# Patient Record
Sex: Female | Born: 1990 | Race: White | Hispanic: No | Marital: Married | Smoking: Former smoker
Health system: Southern US, Community
[De-identification: ages and names within clinical notes are randomized; demographics above are authoritative.]

## PROBLEM LIST (undated history)

## (undated) DIAGNOSIS — F32A Depression, unspecified: Secondary | ICD-10-CM

## (undated) DIAGNOSIS — Z8489 Family history of other specified conditions: Secondary | ICD-10-CM

## (undated) DIAGNOSIS — F419 Anxiety disorder, unspecified: Secondary | ICD-10-CM

## (undated) HISTORY — PX: KNEE SURGERY: SHX244

## (undated) HISTORY — PX: TUBAL LIGATION: SHX77

## (undated) HISTORY — PX: COLONOSCOPY: SHX174

## (undated) HISTORY — PX: CHOLECYSTECTOMY: SHX55

## (undated) HISTORY — PX: RHINOPLASTY: SUR1284

---
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
## 2009-01-26 ENCOUNTER — Emergency Department (HOSPITAL_COMMUNITY): Admission: EM | Admit: 2009-01-26 | Discharge: 2009-01-26 | Payer: Self-pay | Admitting: Emergency Medicine

## 2009-05-16 ENCOUNTER — Inpatient Hospital Stay (HOSPITAL_COMMUNITY): Admission: AD | Admit: 2009-05-16 | Discharge: 2009-05-16 | Payer: Self-pay | Admitting: Obstetrics and Gynecology

## 2009-05-18 ENCOUNTER — Inpatient Hospital Stay (HOSPITAL_COMMUNITY): Admission: AD | Admit: 2009-05-18 | Discharge: 2009-05-18 | Payer: Self-pay | Admitting: Obstetrics & Gynecology

## 2009-05-25 ENCOUNTER — Ambulatory Visit (HOSPITAL_COMMUNITY): Admission: RE | Admit: 2009-05-25 | Discharge: 2009-05-25 | Payer: Self-pay | Admitting: Obstetrics and Gynecology

## 2010-04-14 HISTORY — PX: KNEE SURGERY: SHX244

## 2010-05-05 ENCOUNTER — Encounter: Payer: Self-pay | Admitting: Obstetrics & Gynecology

## 2010-07-04 LAB — HCG, QUANTITATIVE, PREGNANCY
hCG, Beta Chain, Quant, S: 1829 m[IU]/mL — ABNORMAL HIGH (ref <5)
hCG, Beta Chain, Quant, S: 4326 m[IU]/mL — ABNORMAL HIGH (ref <5)

## 2010-07-04 LAB — URINE CULTURE
Colony Count: NO GROWTH
Culture: NO GROWTH

## 2010-07-04 LAB — URINALYSIS, ROUTINE W REFLEX MICROSCOPIC
Bilirubin Urine: NEGATIVE
Glucose, UA: NEGATIVE mg/dL
Ketones, ur: NEGATIVE mg/dL
Nitrite: NEGATIVE
Protein, ur: NEGATIVE mg/dL
Specific Gravity, Urine: 1.02 (ref 1.005–1.030)
Urobilinogen, UA: 0.2 mg/dL (ref 0.0–1.0)
pH: 6 (ref 5.0–8.0)

## 2010-07-04 LAB — WET PREP, GENITAL
Trich, Wet Prep: NONE SEEN
Yeast Wet Prep HPF POC: NONE SEEN

## 2010-07-04 LAB — URINE MICROSCOPIC-ADD ON

## 2010-07-04 LAB — GC/CHLAMYDIA PROBE AMP, GENITAL
Chlamydia, DNA Probe: NEGATIVE
GC Probe Amp, Genital: NEGATIVE

## 2011-09-02 ENCOUNTER — Emergency Department: Payer: Self-pay | Admitting: Emergency Medicine

## 2012-02-11 ENCOUNTER — Ambulatory Visit: Payer: Self-pay | Admitting: Orthopedic Surgery

## 2012-02-21 LAB — COMPREHENSIVE METABOLIC PANEL
Albumin: 4.1 g/dL (ref 3.4–5.0)
Alkaline Phosphatase: 73 U/L (ref 50–136)
Anion Gap: 8 (ref 7–16)
BUN: 8 mg/dL (ref 7–18)
Bilirubin,Total: 0.5 mg/dL (ref 0.2–1.0)
Calcium, Total: 8.6 mg/dL (ref 8.5–10.1)
Chloride: 106 mmol/L (ref 98–107)
Co2: 24 mmol/L (ref 21–32)
Creatinine: 0.72 mg/dL (ref 0.60–1.30)
EGFR (African American): >60
EGFR (Non-African Amer.): >60
Glucose: 84 mg/dL (ref 65–99)
Osmolality: 273 (ref 275–301)
Potassium: 3.5 mmol/L (ref 3.5–5.1)
SGOT(AST): 23 U/L (ref 15–37)
SGPT (ALT): 24 U/L (ref 12–78)
Sodium: 138 mmol/L (ref 136–145)
Total Protein: 7.5 g/dL (ref 6.4–8.2)

## 2012-02-21 LAB — CBC
HCT: 39.2 % (ref 35.0–47.0)
HGB: 13.8 g/dL (ref 12.0–16.0)
MCH: 31.4 pg (ref 26.0–34.0)
MCHC: 35.1 g/dL (ref 32.0–36.0)
MCV: 89 fL (ref 80–100)
Platelet: 237 10*3/uL (ref 150–440)
RBC: 4.38 10*6/uL (ref 3.80–5.20)
RDW: 12.3 % (ref 11.5–14.5)
WBC: 13.2 10*3/uL — ABNORMAL HIGH (ref 3.6–11.0)

## 2012-02-22 ENCOUNTER — Inpatient Hospital Stay: Payer: Self-pay | Admitting: Specialist

## 2012-02-22 LAB — RAPID INFLUENZA A&B ANTIGENS

## 2012-02-23 LAB — CBC WITH DIFFERENTIAL/PLATELET
Basophil #: 0 10*3/uL (ref 0.0–0.1)
Basophil %: 0.1 %
Eosinophil #: 0 10*3/uL (ref 0.0–0.7)
Eosinophil %: 0.1 %
HCT: 35.7 % (ref 35.0–47.0)
HGB: 12 g/dL (ref 12.0–16.0)
Lymphocyte #: 0.9 10*3/uL — ABNORMAL LOW (ref 1.0–3.6)
Lymphocyte %: 4.7 %
MCH: 30.4 pg (ref 26.0–34.0)
MCHC: 33.6 g/dL (ref 32.0–36.0)
MCV: 90 fL (ref 80–100)
Monocyte #: 0.5 x10 3/mm (ref 0.2–0.9)
Monocyte %: 3 %
Neutrophil #: 16.7 10*3/uL — ABNORMAL HIGH (ref 1.4–6.5)
Neutrophil %: 92.1 %
Platelet: 243 10*3/uL (ref 150–440)
RBC: 3.95 10*6/uL (ref 3.80–5.20)
RDW: 12.7 % (ref 11.5–14.5)
WBC: 18.1 10*3/uL — ABNORMAL HIGH (ref 3.6–11.0)

## 2012-02-23 LAB — BASIC METABOLIC PANEL
Anion Gap: 8 (ref 7–16)
BUN: 8 mg/dL (ref 7–18)
Calcium, Total: 8.9 mg/dL (ref 8.5–10.1)
Chloride: 110 mmol/L — ABNORMAL HIGH (ref 98–107)
Co2: 22 mmol/L (ref 21–32)
Creatinine: 0.68 mg/dL (ref 0.60–1.30)
EGFR (African American): >60
EGFR (Non-African Amer.): >60
Glucose: 167 mg/dL — ABNORMAL HIGH (ref 65–99)
Osmolality: 282 (ref 275–301)
Potassium: 4.3 mmol/L (ref 3.5–5.1)
Sodium: 140 mmol/L (ref 136–145)

## 2012-02-24 LAB — CBC WITH DIFFERENTIAL/PLATELET
Basophil #: 0 10*3/uL (ref 0.0–0.1)
Basophil %: 0.1 %
Eosinophil #: 0 10*3/uL (ref 0.0–0.7)
Eosinophil %: 0 %
HCT: 35.7 % (ref 35.0–47.0)
HGB: 11.8 g/dL — ABNORMAL LOW (ref 12.0–16.0)
Lymphocyte #: 1.2 10*3/uL (ref 1.0–3.6)
Lymphocyte %: 6.8 %
MCH: 30.2 pg (ref 26.0–34.0)
MCHC: 33 g/dL (ref 32.0–36.0)
MCV: 91 fL (ref 80–100)
Monocyte #: 0.4 x10 3/mm (ref 0.2–0.9)
Monocyte %: 2.5 %
Neutrophil #: 15.7 10*3/uL — ABNORMAL HIGH (ref 1.4–6.5)
Neutrophil %: 90.6 %
Platelet: 259 10*3/uL (ref 150–440)
RBC: 3.91 10*6/uL (ref 3.80–5.20)
RDW: 12.9 % (ref 11.5–14.5)
WBC: 17.3 10*3/uL — ABNORMAL HIGH (ref 3.6–11.0)

## 2012-02-27 LAB — EXPECTORATED SPUTUM ASSESSMENT W GRAM STAIN, RFLX TO RESP C

## 2012-02-27 LAB — CULTURE, BLOOD (SINGLE)

## 2012-03-30 ENCOUNTER — Ambulatory Visit: Payer: Self-pay | Admitting: Orthopedic Surgery

## 2012-07-23 ENCOUNTER — Emergency Department: Payer: Self-pay | Admitting: Emergency Medicine

## 2012-07-23 LAB — URINALYSIS, COMPLETE
Bilirubin,UR: NEGATIVE
Blood: NEGATIVE
Glucose,UR: NEGATIVE mg/dL (ref 0–75)
Ketone: NEGATIVE
Nitrite: NEGATIVE
Ph: 5 (ref 4.5–8.0)
Protein: NEGATIVE
RBC,UR: 5 /HPF (ref 0–5)
Specific Gravity: 1.027 (ref 1.003–1.030)
Squamous Epithelial: 15
WBC UR: 7 /HPF (ref 0–5)

## 2012-07-23 LAB — COMPREHENSIVE METABOLIC PANEL
Albumin: 4.3 g/dL (ref 3.4–5.0)
Alkaline Phosphatase: 76 U/L (ref 50–136)
Anion Gap: 4 — ABNORMAL LOW (ref 7–16)
BUN: 12 mg/dL (ref 7–18)
Bilirubin,Total: 0.4 mg/dL (ref 0.2–1.0)
Calcium, Total: 8.6 mg/dL (ref 8.5–10.1)
Chloride: 108 mmol/L — ABNORMAL HIGH (ref 98–107)
Co2: 27 mmol/L (ref 21–32)
Creatinine: 0.57 mg/dL — ABNORMAL LOW (ref 0.60–1.30)
EGFR (African American): >60
EGFR (Non-African Amer.): >60
Glucose: 77 mg/dL (ref 65–99)
Osmolality: 276 (ref 275–301)
Potassium: 3.7 mmol/L (ref 3.5–5.1)
SGOT(AST): 29 U/L (ref 15–37)
SGPT (ALT): 29 U/L (ref 12–78)
Sodium: 139 mmol/L (ref 136–145)
Total Protein: 7.8 g/dL (ref 6.4–8.2)

## 2012-07-23 LAB — CBC
HCT: 43.3 % (ref 35.0–47.0)
HGB: 15 g/dL (ref 12.0–16.0)
MCH: 31 pg (ref 26.0–34.0)
MCHC: 34.7 g/dL (ref 32.0–36.0)
MCV: 89 fL (ref 80–100)
Platelet: 237 10*3/uL (ref 150–440)
RBC: 4.86 10*6/uL (ref 3.80–5.20)
RDW: 11.9 % (ref 11.5–14.5)
WBC: 10.5 10*3/uL (ref 3.6–11.0)

## 2012-07-23 LAB — LIPASE, BLOOD: Lipase: 248 U/L (ref 73–393)

## 2012-09-13 ENCOUNTER — Ambulatory Visit: Payer: Self-pay

## 2013-01-13 HISTORY — PX: RHINOPLASTY: SUR1284

## 2013-01-13 HISTORY — PX: SKIN LESION EXCISION: SHX2412

## 2014-04-26 ENCOUNTER — Ambulatory Visit: Payer: Self-pay | Admitting: Obstetrics and Gynecology

## 2014-04-26 LAB — CLOSTRIDIUM DIFFICILE(ARMC)

## 2014-04-27 ENCOUNTER — Ambulatory Visit: Payer: Self-pay | Admitting: Unknown Physician Specialty

## 2014-05-12 ENCOUNTER — Ambulatory Visit: Payer: Self-pay | Admitting: Unknown Physician Specialty

## 2014-05-25 ENCOUNTER — Ambulatory Visit: Payer: Self-pay | Admitting: Internal Medicine

## 2014-06-15 ENCOUNTER — Other Ambulatory Visit: Payer: Self-pay | Admitting: Unknown Physician Specialty

## 2014-07-28 ENCOUNTER — Ambulatory Visit
Admit: 2014-07-28 | Disposition: A | Discharge status: Home / Self Care | Payer: Self-pay | Attending: Family Medicine | Admitting: Family Medicine

## 2014-08-01 NOTE — Op Note (Signed)
PATIENT NAME:  Deborah Sanchez, Deborah Sanchez MR#:  [id number] DATE OF BIRTH:  12/04/1990  DATE OF PROCEDURE:  03/30/2012  PREOPERATIVE DIAGNOSIS: Left knee patellar subluxation.   POSTOPERATIVE DIAGNOSIS: Left knee patellar subluxation.  PROCEDURE: Left knee arthroscopic lateral release.   SURGEON: Leitha SchullerMichael J. Jasiyah Paulding, MD   ANESTHESIA: Spinal.   DESCRIPTION OF PROCEDURE: The patient was brought to the operating room and, after adequate spinal anesthesia was obtained, the left leg was prepped and draped in the usual sterile fashion with a tourniquet applied. After patient identification and timeout procedures were completed, an inferolateral portal was made and the arthroscope was introduced. Inspection revealed mild chondromalacia and some patellar subluxation with a very tight lateral retinaculum. Coming around medially, an inferomedial portal was made and the articular cartilage and meniscus appeared normal and medial and lateral compartments with intact ACL. Gutters were free of any loose bodies. At this point, an ArthroCare wand was used to create a lateral release releasing the tight lateral retinaculum and increasing the space between the patella and the femoral trochlea and getting it more in the midline. The ArthroCare wand was also used to obtain hemostasis of the cut to minimize postoperative bleeding. All instrumentation was withdrawn and 30 mL of 0.5% Sensorcaine with epinephrine was infiltrated in the areas of the portals as well as in the area of the lateral release. Xeroform, 4 x 4's, Webril and Ace wrap were applied. The patient was sent to the recovery room in stable condition.   ESTIMATED BLOOD LOSS: Minimal.          COMPLICATIONS: None.   SPECIMEN: None. ____________________________ Leitha SchullerMichael J. Camryn Lampson, MD mjm:sb D: 03/30/2012 21:49:20 ET T: 03/31/2012 10:04:58 ET JOB#: [id number]  cc: Leitha SchullerMichael J. Jowanda Heeg, MD, <Dictator> Leitha SchullerMICHAEL J Kimba Lottes MD ELECTRONICALLY SIGNED 03/31/2012 13:06

## 2014-08-01 NOTE — H&P (Signed)
PATIENT NAME:  Deborah Sanchez, Deborah Sanchez MR#:  [id number] DATE OF BIRTH:  Sep 11, 1990  DATE OF ADMISSION:  02/22/2012  PRIMARY CARE PHYSICIAN: Barry Brunner, MD   CHIEF COMPLAINT: Cough, shortness of breath and wheezing for two days.   HISTORY OF PRESENT ILLNESS: The patient is a 24 year old Caucasian female with no significant past medical history presenting to the ER with chief complaint of shortness of breath, cough and wheezing for the past two days. She is reporting that her son is sick and coughing. She denies any fever but sweating a lot. She is bringing up clear phlegm. The patient came into the ER as her shortness of breath associated with cough and wheezing is getting worse and she could not breathe. When the patient came into the ER, she was not able to speak in full sentences and she was quite short of breath. She was very tight in her chest but chest x-ray did not show any acute findings. The patient has received IV Solu-Medrol 125 mg and 4 to 5 DuoNeb treatments. Hospitalist team is called to admit the patient as patient was still being tight in her chest after getting several breathing treatments. Blood cultures were obtained and IV levofloxacin was ordered. The patient is complaining of tightness in her chest while coughing. D-dimers are elevated and CT angiogram of the chest is ordered to rule out pulmonary embolism as the patient has subcutaneous implant for birth control and the patient's D-dimer being elevated. CT chest is pending.   PAST MEDICAL HISTORY: None.   PAST SURGICAL HISTORY: Mastoidectomy in 1997.  MEDICATIONS: Not on any home medications.    ALLERGIES: No known drug allergies.   PSYCHOSOCIAL HISTORY: The patient lives at home with her parents and son. Smokes half pack a day. Denies alcohol or illicit drug usage.   FAMILY HISTORY: Father had history of diabetes mellitus.  REVIEW OF SYSTEMS: CONSTITUTIONAL: Denies fever but complaining of fatigue, weakness. Denies any weight  loss or weight gain. EYES: Denies any blurry vision, cataracts, inflammation. ENT: Denies tinnitus, ear pain, hearing loss, snoring, or postnasal drip. RESPIRATORY: Positive cough. Positive wheezing. Denies hemoptysis. Denies asthma. Positive dyspnea. Positive painful respiration. Denies COPD or pneumonia. CARDIOVASCULAR: Chest pain with deep inspiration. Denies any orthopnea. Denies edema. Denies arrhythmia, palpitations, or syncope. GI: Positive nausea. Denies vomiting or diarrhea. Denies any abdominal pain, hematemesis, GERD, or rectal bleeding. GENITOURINARY: Denies dysuria, hematuria. GYN AND BREASTS: Denies any breast masses, tenderness, discharge. ENDOCRINE: Denies polyuria or polydipsia. Denies any thyroid problems. HEMATOLOGIC/LYMPHATIC: Denies anemia, easy bruising, bleeding or swollen glands. INTEGUMENTARY: Denies acne, rash, lesions. MUSCULOSKELETAL: Denies any pain in the neck, back, shoulder. NEUROLOGIC: Denies any dysarthria, weakness, or numbness. PSYCH: Denies any insomnia, ADD, OCD.   PHYSICAL EXAMINATION:   VITAL SIGNS: Temperature 98.4, pulse 110, respirations initially 23, eventually trended down to 19 to 21, pulse oximetry 93%. Blood pressure 115/63 after fluid bolus.   GENERAL APPEARANCE: Sick looking well built and well nourished not in acute distress.   HEENT: Normocephalic, atraumatic. Pupils are equally reacting to light and accommodation. Somewhat dry mucous membranes. The patient's face is flushed.   NECK: Supple. No JVD. No carotid bruits. No thyromegaly.   LUNGS: Moderate air entry. Bronchial breath sounds. No rhonchi. Positive wheezing diffusely bilaterally.   CARDIAC: S1, S2 normal. Regular rate and rhythm, tachycardic. Point of maximum impulse is intact. No reproducible tenderness in the anterior chest wall.  GI: Soft. Bowel sounds are positive in all four quadrants. Nontender, nondistended. No masses  felt.  NEUROLOGIC: Alert and oriented x3. Cranial nerves II  through XII are grossly intact. Reflexes are 2+.  SKIN: No lesions. No rashes. No erythema.   EXTREMITIES: No edema. No cyanosis. No clubbing.  LABS AND IMAGING STUDIES: Chest x-ray no acute findings.   CT angiogram of the chest is pending.   Urine pregnancy test is pending. Nasal swab for Influenza A and B is pending. Glucose 84, BUN 8, creatinine 0.72, sodium 138, potassium 3.5, chloride 106, CO2 24, GFR greater than 60, serum osmolality 273, calcium 8.6, WBC 13.2, hemoglobin 13.8, hematocrit 39.2, platelet count 237,000. D-dimer is elevated at 0.48.   ASSESSMENT AND PLAN:  1. Acute bronchitis, probably viral. Will admit to inpatient status. Solu-Medrol 60 mg IV q.6 hours will be provided. Levofloxacin 750 mg IV daily. DuoNeb neb treatments q.6 hours and albuterol neb treatments on an as needed basis. Nasal swab for Influenza A and B ordered.  2. Elevated D-dimer and history of implant for contraception. CT angiogram of the chest is ordered to rule out pulmonary embolism which is of low probability.  3. Nicotine dependence. The patient was provided with counseling to quit smoking. Will provide her nicotine patch.  4. Will provide her GI prophylaxis with ranitidine.  5. DVT prophylaxis with TEDs.  Plan of care was discussed in detail with the patient. She is agreeable with the plan.   TOTAL TIME SPENT ON THIS ADMISSION: 50 minutes.   ____________________________ Ramonita LabAruna Ceniya Fowers, MD ag:drc D: 02/22/2012 04:03:59 ET T: 02/22/2012 09:50:40 ET JOB#: [id number]  cc: Ramonita LabAruna Lauri Till, MD, <Dictator> Jorje GuildGlenn R. Beckey DowningWillett, MD Ramonita LabARUNA Amandeep Nesmith MD ELECTRONICALLY SIGNED 02/22/2012 22:50

## 2014-08-01 NOTE — Discharge Summary (Signed)
PATIENT NAME:  Deborah Sanchez, [name] MR#:  [id number] DATE OF BIRTH:  1990/12/08  DATE OF ADMISSION:  02/22/2012 DATE OF DISCHARGE:  02/25/2012  For a detailed note, please take a look at the history and physical on admission by Dr. Amado CoeGouru.    DIAGNOSES AT DISCHARGE:  1. Acute respiratory failure likely secondary to asthma exacerbation/reactive airway disease.  2. Asthma exacerbation/reactive airway disease.  3. Leukocytosis.   DIET: Patient is being discharged on a regular diet.   ACTIVITY: As tolerated.   FOLLOW UP: Follow up is with Dr. Barry BrunnerGlenn Willett in the next 1 to 2 weeks.   DISCHARGE MEDICATIONS:  1. Prednisone taper starting at 60 mg down to 10 mg over the next 12 days.  2. Advair 250/50, 1 puff b.i.d.  3. Albuterol inhaler 2 puffs 4 times daily as needed.   LABORATORY, DIAGNOSTIC AND RADIOLOGICAL DATA: CT scan of the chest done with contrast on admission showing no evidence of pulmonary embolism but patchy airspace disease with ground glass opacities involving the right perihilar region and right upper lobe and left lower lobe secondary to infectious or inflammatory etiology. Moderate pneumomediastinum.  A chest x-ray showing no acute cardiopulmonary disease.   BRIEF HOSPITAL COURSE: This is a 24 year old female with no significant past medical history who presented to the hospital with shortness of breath and cough likely secondary to asthma/reactive airway disease exacerbation.  1. Acute respiratory failure. This was likely secondary to asthma exacerbation. Patient has had some allergic rhinitis but never been diagnosed with asthma. She presented with severe bronchospasm and wheezing. She was therefore started on IV steroids, empiric antibiotics and also around-the-clock nebulizer treatments along with inhaled corticosteroids. Patient's clinical symptoms since admission have significantly improved. She still has some wheezing and bronchospasm but is moving good air and her shortness  of breath has significantly improved. She likely has undiagnosed asthma. For now I am discharging her on a long prednisone taper along with an albuterol inhaler and also Advair. She likely should have outpatient PFTs and also follow up with pulmonary as an outpatient. She does smoke tobacco and was strongly advised to quit smoking.  2. Leukocytosis. This was likely secondary to steroids. Her chest x-ray and her CT chest did not show any evidence of acute pneumonia. Her white cell count can be further followed as an outpatient.  3. Pneumomediastinum. This was incidentally noted on the CT scan on admission. I discussed this with a pulmonologist who did not think that the patient had an acute esophageal injury or any evidence of air in the mediastinum. This was probably secondary to air trapping from her asthma exacerbation. She clinically does not show any signs of severe hypoxemia. This can be further followed by a pulmonologist as an outpatient if needed. She likely would benefit from outpatient PFTs.  4. Tobacco abuse. Patient was strongly advised to quit smoking, was maintained on nicotine patch while in the hospital.  5. CODE STATUS: Patient is a FULL CODE.   TIME SPENT ON DISCHARGE: 40 minutes.  ____________________________ Rolly PancakeVivek J. Cherlynn KaiserSainani, MD vjs:cms D: 02/25/2012 15:06:40 ET T: 02/25/2012 17:16:09 ET JOB#: [id number]  cc: Rolly PancakeVivek J. Cherlynn KaiserSainani, MD, <Dictator> Jorje GuildGlenn R. Beckey DowningWillett, MD Houston SirenVIVEK J Deaken Jurgens MD ELECTRONICALLY SIGNED 02/26/2012 8:12

## 2014-08-07 LAB — SURGICAL PATHOLOGY

## 2014-12-03 ENCOUNTER — Ambulatory Visit
Admission: EM | Admit: 2014-12-03 | Discharge: 2014-12-03 | Disposition: A | Discharge status: Home / Self Care | Payer: BLUE CROSS/BLUE SHIELD | Attending: Family Medicine | Admitting: Family Medicine

## 2014-12-03 ENCOUNTER — Ambulatory Visit: Payer: BLUE CROSS/BLUE SHIELD

## 2014-12-03 DIAGNOSIS — S83002S Unspecified subluxation of left patella, sequela: Secondary | ICD-10-CM

## 2014-12-03 DIAGNOSIS — F17200 Nicotine dependence, unspecified, uncomplicated: Secondary | ICD-10-CM | POA: Diagnosis not present

## 2014-12-03 DIAGNOSIS — S83002A Unspecified subluxation of left patella, initial encounter: Secondary | ICD-10-CM | POA: Diagnosis not present

## 2014-12-03 DIAGNOSIS — X58XXXS Exposure to other specified factors, sequela: Secondary | ICD-10-CM | POA: Diagnosis not present

## 2014-12-03 DIAGNOSIS — M25562 Pain in left knee: Secondary | ICD-10-CM | POA: Diagnosis present

## 2014-12-03 MED ORDER — KETOROLAC TROMETHAMINE 60 MG/2ML IM SOLN
60.0000 mg | Freq: Once | INTRAMUSCULAR | Status: AC
  Administered 2014-12-03: 60 mg via INTRAMUSCULAR

## 2014-12-03 MED ORDER — NAPROXEN 500 MG PO TABS: 500.0000 mg | ORAL_TABLET | Freq: Two times a day (BID) | ORAL | Status: DC

## 2014-12-03 NOTE — ED Notes (Signed)
Pt with Left knee pain, chronic, post surgery. Injured leg last week. Pain, tingling down leg.

## 2014-12-03 NOTE — ED Provider Notes (Signed)
CSN: [id number]     Arrival date & time 12/03/14  1458 History   First MD Initiated Contact with Patient 12/03/14 1551     Chief Complaint  Patient presents with  . Knee Pain   (Consider location/radiation/quality/duration/timing/severity/associated sxs/prior Treatment) HPI  The 24 year old female who is had a number of problems with her left knee and previous  patella injuries described as dislocation subluxations. In 2014 she underwent arthroscopy with lateral retinacular release according to her description. She got better over time but this [place] pier onto the sand and with a twisting motion again subluxed her knee medially. It hurt at first but gradually improved until at work she turned a corner and happened again. At that time she had to "knock it back in place" to reduce it. She then went on a long walk with a friend and since then has been having a lot of pain in her knee. To her she says it looks like it's out of alignment. This may be from some slight swelling that she has anteriorly.  History reviewed. No pertinent past medical history. Past Surgical History  Procedure Laterality Date  . Knee surgery    . Rhinoplasty     History reviewed. No pertinent family history. Social History  Substance Use Topics  . Smoking status: Current Every Day Smoker  . Smokeless tobacco: Never Used  . Alcohol Use: No   OB History    No data available     Review of Systems  Constitutional: Positive for activity change.  Musculoskeletal: Positive for arthralgias.  All other systems reviewed and are negative.   Allergies  Review of patient's allergies indicates no known allergies.  Home Medications   Prior to Admission medications   Medication Sig Start Date End Date Taking? Authorizing Provider  norgestimate-ethinyl estradiol (ORTHO-CYCLEN,SPRINTEC,PREVIFEM) 0.25-35 MG-MCG tablet Take 1 tablet by mouth daily.   Yes Historical Provider, MD   naproxen (NAPROSYN) 500 MG tablet Take 1 tablet (500 mg total) by mouth 2 (two) times daily with a meal. 12/03/14   Lutricia Feil, PA-C   BP 92/56 mmHg  Pulse 81  Temp(Src) 98 F (36.7 C) (Oral)  Resp 20  Ht 5\' 3"  (1.6 m)  Wt 160 lb (72.576 kg)  BMI 28.35 kg/m2  SpO2 100%  LMP 11/08/2014 (Approximate) Physical Exam  Constitutional: She appears well-developed and well-nourished.  HENT:  Head: Normocephalic and atraumatic.  Eyes: Pupils are equal, round, and reactive to light.  Musculoskeletal:  Examination of her left knee has no effusion. There is a very mild anterior puffiness but no actual swelling induration or fluctuance present. She does have a positive Patellar apprehension test and some patellar tenderness. Is no effusion present range of motion shows the patella to track adequately. She has no  lateral or medial lateral ligament stability. Is a negative anterior drawer sign. Her quadriceps mechanism is soft but contracts though she complains of pain with that.  Nursing note and vitals reviewed.   ED Course  Procedures (including critical care time) Labs Review Labs Reviewed - No data to display  Imaging Review Dg Knee Ap/lat W/sunrise Left  12/03/2014   CLINICAL DATA:  Left knee patellar subluxation yesterday. History of arthroscopy in 2014 with a lateral release.  EXAM: LEFT KNEE 3 VIEWS  COMPARISON:  Knee MRI, 02/11/2012  FINDINGS: There is no evidence of fracture, dislocation, or joint effusion. There is no evidence of arthropathy or other focal bone abnormality.  Soft tissues are unremarkable.  IMPRESSION: Negative.   Electronically Signed   By: Amie Portland M.D.   On: 12/03/2014 16:27     MDM   1. Patellar subluxation, left, sequela    New Prescriptions   NAPROXEN (NAPROSYN) 500 MG TABLET    Take 1 tablet (500 mg total) by mouth 2 (two) times daily with a meal.  Plan: 1. Test/x-ray results and diagnosis reviewed with patient 2. rx as per orders; risks,  benefits, potential side effects reviewed with patient 3. Recommend supportive treatment with  4. F/u prn if symptoms worsen or don't improve  I reviewed the x-rays with the patient and her husband. I've recommended that she have a long leg and knee immobilizer protect her patella. Also instructed her in quadriceps strengthening exercises using isometrics only. Given her prescription for Naprosyn for pain and inflammation and provided a Toradol injection today for pain relief. I recommended that she follow-up with her orthopedic surgeon for follow-up. 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 Sections are for a two-week duration.  Lutricia Feil, PA-C 12/03/14 1646

## 2015-02-06 ENCOUNTER — Other Ambulatory Visit: Payer: Self-pay | Admitting: Unknown Physician Specialty

## 2015-02-06 DIAGNOSIS — M25562 Pain in left knee: Secondary | ICD-10-CM

## 2015-02-15 ENCOUNTER — Ambulatory Visit
Admission: RE | Admit: 2015-02-15 | Discharge: 2015-02-15 | Disposition: A | Discharge status: Home / Self Care | Payer: BLUE CROSS/BLUE SHIELD | Source: Ambulatory Visit | Attending: Unknown Physician Specialty | Admitting: Unknown Physician Specialty

## 2015-02-15 DIAGNOSIS — M2392 Unspecified internal derangement of left knee: Secondary | ICD-10-CM | POA: Insufficient documentation

## 2015-02-15 DIAGNOSIS — M25562 Pain in left knee: Secondary | ICD-10-CM

## 2015-10-10 ENCOUNTER — Ambulatory Visit
Admission: EM | Admit: 2015-10-10 | Discharge: 2015-10-10 | Disposition: A | Discharge status: Home / Self Care | Payer: BLUE CROSS/BLUE SHIELD | Attending: Family Medicine | Admitting: Family Medicine

## 2015-10-10 ENCOUNTER — Ambulatory Visit (INDEPENDENT_AMBULATORY_CARE_PROVIDER_SITE_OTHER): Payer: BLUE CROSS/BLUE SHIELD

## 2015-10-10 DIAGNOSIS — S7011XA Contusion of right thigh, initial encounter: Secondary | ICD-10-CM

## 2015-10-10 DIAGNOSIS — R0789 Other chest pain: Secondary | ICD-10-CM

## 2015-10-10 MED ORDER — KETOROLAC TROMETHAMINE 60 MG/2ML IM SOLN
60.0000 mg | Freq: Once | INTRAMUSCULAR | Status: AC
  Administered 2015-10-10: 60 mg via INTRAMUSCULAR

## 2015-10-10 NOTE — ED Provider Notes (Signed)
CSN: [id number]     Arrival date & time 10/10/15  [id number] History   First MD Initiated Contact with Patient 10/10/15 [phone]     Chief Complaint  Patient presents with  . Chest Pain   (Consider location/radiation/quality/duration/timing/severity/associated sxs/prior Treatment) HPI  25 yo F  Handled 85 pound dog post op at the vet Thursday -6 days PTA- no acute discomfort Had aching discomfort of back and chest wall develop 2 days later-Saturday  Sunday she spun away from husband's weedeater on Sunday and slipped /tripped over picnic table   Striking her right thigh on picnic table and right lateral calf against bench-denies falling to ground or hitting head  Taking ibuprofen and Tramadol for pain without change...has taken nothing today and c/o pain    8 year smoke  1/2 PPD x 8 years- smoking without difficulty History reviewed. No pertinent past medical history. Past Surgical History  Procedure Laterality Date  . Knee surgery    . Rhinoplasty    . Mastoidectomy  1997   Family History  Problem Relation Age of Onset  . Diabetes Father    Social History  Substance Use Topics  . Smoking status: Current Every Day Smoker -- 0.50 packs/day for 7 years    Types: Cigarettes  . Smokeless tobacco: Never Used  . Alcohol Use: No   OB History    No data available     Review of Systems.Constitutional: No fever. No headache. Eyes: No visual changes. ENT:No sore throat. Cardiovascular:Negative for palpitations- c/o chest wall pain Respiratory: Negative for shortness of breath-speaking in rapid full sentences Gastrointestinal: No abdominal pain. No nausea,vomiting, diarrhea-good appetite Genitourinary: Negative for dysuria. Normal urination. Musculoskeletal: Upper left back /scapula for back pain. FROM extremities without pain shoulders /elbows Skin: Negative for rash Neurological: Negative for headache, focal weakness or numbness  Allergies  Review of patient's allergies indicates no  known allergies.  Home Medications   Prior to Admission medications   Medication Sig Start Date End Date Taking? Authorizing Provider  sertraline (ZOLOFT) 25 MG tablet Take 25 mg by mouth daily.   Yes Historical Provider, MD  naproxen (NAPROSYN) 500 MG tablet Take 1 tablet (500 mg total) by mouth 2 (two) times daily with a meal. 12/03/14   Lutricia FeilWilliam P Roemer, PA-C  norgestimate-ethinyl estradiol (ORTHO-CYCLEN,SPRINTEC,PREVIFEM) 0.25-35 MG-MCG tablet Take 1 tablet by mouth daily.    Historical Provider, MD   Meds Ordered and Administered this Visit   Medications  ketorolac (TORADOL) injection 60 mg (60 mg Intramuscular Given 10/10/15 0944)  well tolerated with some pain relief accomplished ; complaining of injection discomfort  BP 99/69 mmHg  Pulse 79  Temp(Src) 97.2 F (36.2 C) (Tympanic)  Resp 16  Ht 5\' 2"  (1.575 m)  Wt 174 lb (78.926 kg)  BMI 31.82 kg/m2  SpO2 98%  LMP 09/30/2015 (Exact Date) No data found.   Physical Exam   Constitutional -alert and oriented,well appearing , teary and upset General:  Distress initially-laughing and talking during exam Head-atraumatic, normocephalic Eyes- conjunctiva normal, EOMI ,conjugate gaze Ears: grossly normal hearing Nose- no congestion or rhinorrhea Mouth/throat- mucous membranes moist ,oropharynx non-erythematous Neck- supple without glandular enlargement CV- regular rate and rhythmn Resp-no distress, normal respiratory effort with coaching,clear to auscultation bilaterally; complains of severe pain with palpation of ribs left lateral mid chest/back GI- soft,non-tender,no distention GU- not examined MSK- non tender, normal ROM, ambulatory, no gait instability,on /off table solo; FROM bilateral shoulders, good grip = bilat; DTRs present and =; c/o pain Tenderness  palpation intercostal spaces and paraspinous/scapula- no bruising or swelling noted Neuro- normal speech and language, no gross focal neurological deficit appreciated,   Skin-warm,dry ,intact;  Large 10x12 cm area of ecchymosis right upper lateral thigh with central superficial scratch; smaller bruise right lateral calf  Psych-mood and affect grossly normal; speech and behavior grossly normal ED Course  Procedures (including critical care time)  Labs Review Labs Reviewed - No data to display  Imaging Review Dg Ribs Unilateral W/chest Left  10/10/2015  CLINICAL DATA:  Fall.  Left chest pain EXAM: LEFT RIBS AND CHEST - 3+ VIEW COMPARISON:  02/22/2012 FINDINGS: No fracture or other bone lesions are seen involving the ribs. There is no evidence of pneumothorax or pleural effusion. Both lungs are clear. Heart size and mediastinal contours are within normal limits. IMPRESSION: Negative. Electronically Signed   By: Marlan Palauharles  Clark M.D.   On: 10/10/2015 10:13        MDM   1. Contusion of right thigh, initial encounter   2. Left-sided chest wall pain    Plan: Test/x-ray results and diagnosis reviewed with patient- neg for fracture Rx as per orders;  benefits, risks, potential side effects reviewed   Recommend supportive treatment with cyclic tylenol and ibuprofen- Has 800 mg Rx - use 1 TID with meals for 3 days; heat or ice as preferred- topical sport cream Has Flexeril at home and may use one at bedtime to aid sleep;  Seek additional medical care if symptoms worsen or are not improving No prescriptions given-  [facility]     Rae HalstedLaurie W Alijah Hyde, PA-C 10/10/15 1124

## 2015-10-10 NOTE — ED Notes (Signed)
Patient states that she took her 84lb dog to the vet on Thursday and she picked him up and wrestled with him. Patient states that she also fell down yesterday. Patient complains of left rib pain, worse with breathing. Patient states that pain started on Saturday but worsened yesterday after her fall.

## 2015-10-10 NOTE — Discharge Instructions (Signed)
Use your Ibuprofen 800 mg three times a day with meals for 3 days--- sports creams /heat pad as desired--full activity fine as tolerated- advance. Expect that you will have residual and postional soreness for a week /10 days or longer.  Seek follow up if there is exacerbation of symptoms You may use Flexeril you have at home at bedtime to help sleep comfort  Chest Wall Pain Chest wall pain is pain in or around the bones and muscles of your chest. Sometimes, an injury causes this pain. Sometimes, the cause may not be known. This pain may take several weeks or longer to get better. HOME CARE INSTRUCTIONS  Pay attention to any changes in your symptoms. Take these actions to help with your pain:   Rest as told by your health care provider.   Avoid activities that cause pain. These include any activities that use your chest muscles or your abdominal and side muscles to lift heavy items.   If directed, apply ice to the painful area:  Put ice in a plastic bag.  Place a towel between your skin and the bag.  Leave the ice on for 20 minutes, 2-3 times per day.  Take over-the-counter and prescription medicines only as told by your health care provider.  Do not use tobacco products, including cigarettes, chewing tobacco, and e-cigarettes. If you need help quitting, ask your health care provider.  Keep all follow-up visits as told by your health care provider. This is important. SEEK MEDICAL CARE IF:  You have a fever.  Your chest pain becomes worse.  You have new symptoms. SEEK IMMEDIATE MEDICAL CARE IF:  You have nausea or vomiting.  You feel sweaty or light-headed.  You have a cough with phlegm (sputum) or you cough up blood.  You develop shortness of breath.   This information is not intended to replace advice given to you by your health care provider. Make sure you discuss any questions you have with your health care provider.   Document Released: 03/31/2005 Document Revised:  12/20/2014 Document Reviewed: 06/26/2014 Elsevier Interactive Patient Education [employer]2016 [employer]

## 2016-02-13 HISTORY — PX: DIAGNOSTIC LAPAROSCOPY: SUR761

## 2016-04-14 HISTORY — PX: CHOLECYSTECTOMY: SHX55

## 2016-04-14 HISTORY — PX: TUBAL LIGATION: SHX77

## 2016-09-01 ENCOUNTER — Other Ambulatory Visit: Payer: Self-pay | Admitting: Neurosurgery

## 2016-09-01 DIAGNOSIS — R51 Headache: Secondary | ICD-10-CM

## 2016-09-01 DIAGNOSIS — R519 Headache, unspecified: Secondary | ICD-10-CM

## 2016-09-01 DIAGNOSIS — M546 Pain in thoracic spine: Secondary | ICD-10-CM

## 2016-09-04 ENCOUNTER — Encounter: Payer: Self-pay | Admitting: Radiology

## 2016-09-04 ENCOUNTER — Ambulatory Visit
Admission: RE | Admit: 2016-09-04 | Discharge: 2016-09-04 | Disposition: A | Discharge status: Home / Self Care | Payer: BLUE CROSS/BLUE SHIELD | Source: Ambulatory Visit | Attending: Neurosurgery | Admitting: Neurosurgery

## 2016-09-04 DIAGNOSIS — G8929 Other chronic pain: Secondary | ICD-10-CM | POA: Insufficient documentation

## 2016-09-04 DIAGNOSIS — M546 Pain in thoracic spine: Secondary | ICD-10-CM | POA: Insufficient documentation

## 2016-09-04 DIAGNOSIS — M545 Low back pain: Secondary | ICD-10-CM | POA: Insufficient documentation

## 2016-09-12 ENCOUNTER — Ambulatory Visit
Admission: RE | Admit: 2016-09-12 | Discharge: 2016-09-12 | Disposition: A | Discharge status: Home / Self Care | Payer: BLUE CROSS/BLUE SHIELD | Source: Ambulatory Visit | Attending: Neurosurgery | Admitting: Neurosurgery

## 2016-09-12 ENCOUNTER — Encounter: Payer: Self-pay | Admitting: Radiology

## 2016-09-12 DIAGNOSIS — G8929 Other chronic pain: Secondary | ICD-10-CM | POA: Insufficient documentation

## 2016-09-12 DIAGNOSIS — R51 Headache: Secondary | ICD-10-CM

## 2016-09-12 DIAGNOSIS — M546 Pain in thoracic spine: Secondary | ICD-10-CM

## 2016-09-12 DIAGNOSIS — M50223 Other cervical disc displacement at C6-C7 level: Secondary | ICD-10-CM | POA: Insufficient documentation

## 2016-09-12 DIAGNOSIS — R519 Headache, unspecified: Secondary | ICD-10-CM

## 2016-09-12 DIAGNOSIS — M545 Low back pain: Secondary | ICD-10-CM | POA: Insufficient documentation

## 2016-09-15 ENCOUNTER — Ambulatory Visit
Admission: EM | Admit: 2016-09-15 | Discharge: 2016-09-15 | Discharge status: Absent without leave | Payer: BLUE CROSS/BLUE SHIELD

## 2017-10-27 DIAGNOSIS — Z9889 Other specified postprocedural states: Secondary | ICD-10-CM | POA: Insufficient documentation

## 2017-10-28 ENCOUNTER — Other Ambulatory Visit: Payer: Self-pay | Admitting: Orthopedic Surgery

## 2017-10-28 DIAGNOSIS — Z9889 Other specified postprocedural states: Secondary | ICD-10-CM

## 2017-10-28 DIAGNOSIS — M2392 Unspecified internal derangement of left knee: Secondary | ICD-10-CM

## 2017-10-28 DIAGNOSIS — M25362 Other instability, left knee: Secondary | ICD-10-CM

## 2017-10-28 DIAGNOSIS — M2352 Chronic instability of knee, left knee: Secondary | ICD-10-CM

## 2017-11-06 ENCOUNTER — Ambulatory Visit
Admission: RE | Admit: 2017-11-06 | Discharge: 2017-11-06 | Disposition: A | Discharge status: Home / Self Care | Payer: Managed Care, Other (non HMO) | Source: Ambulatory Visit | Attending: Orthopedic Surgery | Admitting: Orthopedic Surgery

## 2017-11-06 DIAGNOSIS — M2352 Chronic instability of knee, left knee: Secondary | ICD-10-CM

## 2017-11-06 DIAGNOSIS — X58XXXA Exposure to other specified factors, initial encounter: Secondary | ICD-10-CM | POA: Diagnosis not present

## 2017-11-06 DIAGNOSIS — M25362 Other instability, left knee: Secondary | ICD-10-CM | POA: Diagnosis present

## 2017-11-06 DIAGNOSIS — S72415A Nondisplaced unspecified condyle fracture of lower end of left femur, initial encounter for closed fracture: Secondary | ICD-10-CM | POA: Diagnosis not present

## 2017-11-06 DIAGNOSIS — R600 Localized edema: Secondary | ICD-10-CM | POA: Diagnosis not present

## 2017-11-06 DIAGNOSIS — M2392 Unspecified internal derangement of left knee: Secondary | ICD-10-CM | POA: Diagnosis not present

## 2017-11-06 DIAGNOSIS — Z9889 Other specified postprocedural states: Secondary | ICD-10-CM

## 2017-11-26 ENCOUNTER — Ambulatory Visit
Admission: RE | Admit: 2017-11-26 | Discharge: 2017-11-26 | Disposition: A | Discharge status: Home / Self Care | Payer: Managed Care, Other (non HMO) | Source: Ambulatory Visit | Attending: Orthopedic Surgery | Admitting: Orthopedic Surgery

## 2017-11-26 ENCOUNTER — Other Ambulatory Visit: Payer: Self-pay | Admitting: Orthopedic Surgery

## 2017-11-26 DIAGNOSIS — M25562 Pain in left knee: Secondary | ICD-10-CM

## 2017-11-26 DIAGNOSIS — M2170 Unequal limb length (acquired), unspecified site: Secondary | ICD-10-CM | POA: Diagnosis not present

## 2018-01-26 ENCOUNTER — Other Ambulatory Visit: Payer: Self-pay | Admitting: Orthopedic Surgery

## 2018-01-26 DIAGNOSIS — S72425A Nondisplaced fracture of lateral condyle of left femur, initial encounter for closed fracture: Secondary | ICD-10-CM

## 2018-02-02 ENCOUNTER — Ambulatory Visit
Payer: Managed Care, Other (non HMO) | Attending: Nurse Practitioner | Admitting: Student in an Organized Health Care Education/Training Program

## 2018-02-02 ENCOUNTER — Other Ambulatory Visit: Payer: Self-pay

## 2018-02-02 ENCOUNTER — Encounter: Payer: Self-pay | Admitting: Student in an Organized Health Care Education/Training Program

## 2018-02-02 VITALS — BP 110/64 | HR 83 | Temp 98.5°F | Resp 16 | Ht 62.0 in | Wt 179.5 lb

## 2018-02-02 DIAGNOSIS — M84452S Pathological fracture, left femur, sequela: Secondary | ICD-10-CM | POA: Diagnosis not present

## 2018-02-02 DIAGNOSIS — M84452A Pathological fracture, left femur, initial encounter for fracture: Secondary | ICD-10-CM | POA: Insufficient documentation

## 2018-02-02 DIAGNOSIS — Z833 Family history of diabetes mellitus: Secondary | ICD-10-CM | POA: Diagnosis not present

## 2018-02-02 DIAGNOSIS — Z79899 Other long term (current) drug therapy: Secondary | ICD-10-CM | POA: Diagnosis not present

## 2018-02-02 DIAGNOSIS — F17210 Nicotine dependence, cigarettes, uncomplicated: Secondary | ICD-10-CM | POA: Insufficient documentation

## 2018-02-02 DIAGNOSIS — G894 Chronic pain syndrome: Secondary | ICD-10-CM | POA: Insufficient documentation

## 2018-02-02 DIAGNOSIS — E669 Obesity, unspecified: Secondary | ICD-10-CM | POA: Insufficient documentation

## 2018-02-02 DIAGNOSIS — M25562 Pain in left knee: Secondary | ICD-10-CM | POA: Diagnosis present

## 2018-02-02 DIAGNOSIS — Z6832 Body mass index (BMI) 32.0-32.9, adult: Secondary | ICD-10-CM | POA: Diagnosis not present

## 2018-02-02 DIAGNOSIS — Z9889 Other specified postprocedural states: Secondary | ICD-10-CM

## 2018-02-02 DIAGNOSIS — G8929 Other chronic pain: Secondary | ICD-10-CM | POA: Insufficient documentation

## 2018-02-02 MED ORDER — GABAPENTIN 100 MG PO CAPS: ORAL_CAPSULE | ORAL | 1 refills | Status: DC

## 2018-02-02 NOTE — Progress Notes (Signed)
Safety precautions to be maintained throughout the outpatient stay will include: orient to surroundings, keep bed in low position, maintain call bell within reach at all times, provide assistance with transfer out of bed and ambulation.  

## 2018-02-02 NOTE — Patient Instructions (Signed)
You have gabapentin that has been sent to your pharmacy.

## 2018-02-02 NOTE — Progress Notes (Signed)
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 well as the thoracic spine were obtained without intravenous contrast.  COMPARISON:  Prior CT from 09/04/2016.  FINDINGS: MRI HEAD FINDINGS  Brain: Cerebral volume normal. No focal parenchymal signal abnormality identified. No abnormal foci of restricted diffusion to suggest acute or subacute ischemia. Gray-white matter differentiation well maintained. No encephalomalacia to suggest chronic infarction. No evidence for acute or chronic intracranial hemorrhage.  No mass lesion, midline shift or mass effect. Ventricles normal size without evidence for hydrocephalus. No extra-axial fluid collection. Major dural sinuses are grossly patent.  Pituitary gland suprasellar region within normal limits. Midline structures intact and normal.  Vascular: Major intracranial vascular flow voids are well  maintained and are normal.  Skull and upper cervical spine: Craniocervical junction within normal limits. No Chiari malformation. Bone marrow signal intensity normal. No scalp soft tissue abnormality.  Sinuses/Orbits: Globes and orbital soft tissues within normal limits. Mild scattered mucosal thickening within the ethmoidal air cells. Paranasal sinuses are otherwise clear. No mastoid effusion. Inner ear structures are normal.  MRI CERVICAL SPINE FINDINGS  Alignment: Straightening of the normal cervical lordosis. No listhesis.  Vertebrae: Vertebral body heights are well maintained. No evidence for acute, subacute, or chronic fracture. Signal intensity within the vertebral body bone marrow is normal. No discrete or worrisome osseous lesions. No abnormal marrow edema.  Cord: Tiny central syrinx noted within the cervical spinal cord at the level of C6-7 (series 17, image 20). This measures up to 2 mm in maximal diameter measures approximately 2.6 cm in length. This continues inferiorly within the thoracic spinal cord as below. Signal intensity within the cervical spinal cord is otherwise normal.  Posterior Fossa, vertebral arteries, paraspinal tissues: Paraspinous and prevertebral soft tissues within normal limits. Normal intravascular  flow voids present within the vertebral arteries bilaterally.  Disc levels:  C2-C3: Unremarkable.  C3-C4:  Unremarkable.  C4-C5:  Unremarkable.  C5-C6:  Unremarkable.  C6-C7: Shallow right paracentral disc protrusion indents the right ventral thecal sac (series 18, image 19). No significant stenosis or cord flattening. Foramina remain widely patent.  C7-T1:  Unremarkable.  MRI THORACIC SPINE FINDINGS  Alignment: Vertebral bodies normally aligned with preservation of the normal thoracic kyphosis. No listhesis.  Vertebrae: Vertebral body heights are well maintained. No evidence for acute, subacute, or chronic fracture. Signal  intensity within the vertebral body bone marrow is normal. No discrete or worrisome osseous lesions. No abnormal marrow edema.  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 This measures up to 3 mm in maximal diameter at the level of T4-5 (series 26, image 13). Signal intensity within the thoracic spinal cord otherwise normal. Conus medullaris terminates at the L1 level.  Paraspinous soft tissues: Paraspinous soft tissues within normal limits. Visualized lungs are clear. Visualized visceral structures unremarkable.  Disc levels:  T11-12:  Minimal disc bulge with disc desiccation.  No stenosis.  No other significant degenerative changes identified within the thoracic spine. No other significant disc bulge disc protrusion. No canal or foraminal stenosis.  IMPRESSION: 1. Normal MRI of the brain. 2. 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 3. Small right paracentral disc protrusion at C6-7 without stenosis. 4. Minimal disc bulge at T11-12 without stenosis. 5. Otherwise normal MRI of the cervical and thoracic spine.   Electronically Signed   By: Jeannine Boga M.D.   On: 09/12/2016 20:28    Psychological-Psychiatric: Anxiousness and Depressed Gastrointestinal: Reflux or heatburn Genitourinary: No reported renal or genitourinary signs or symptoms such as difficulty voiding or producing urine, peeing blood, non-functioning kidney, kidney stones, difficulty emptying the bladder, difficulty controlling the flow of urine, or chronic kidney disease Hematological: No reported hematological signs or symptoms such as prolonged bleeding, low or poor functioning platelets, bruising or bleeding easily, hereditary bleeding problems, low energy levels due to low hemoglobin or being anemic Endocrine: No  reported endocrine signs or symptoms such as high or low blood sugar, rapid heart rate due to high thyroid levels, obesity or weight gain due to slow thyroid or thyroid disease Rheumatologic: No reported rheumatological signs and symptoms such as fatigue, joint pain, tenderness, swelling, redness, heat, stiffness, decreased range of motion, with or without associated rash Musculoskeletal: Negative for myasthenia gravis, muscular dystrophy, multiple sclerosis or malignant hyperthermia Work History: Quit going to work on his/her own  Allergies  Ms. Fifield has No Known Allergies.  Laboratory Chemistry  Inflammation Markers (CRP: Acute Phase) (ESR: Chronic Phase) No results found for: CRP, ESRSEDRATE, LATICACIDVEN                       Rheumatology Markers No results found for: RF, ANA, LABURIC, URICUR, LYMEIGGIGMAB, LYMEABIGMQN, HLAB27                      Renal Function Markers Lab Results  Component Value Date   BUN 12 07/23/2012   CREATININE 0.57 (L) 07/23/2012   GFRAA >60 07/23/2012   GFRNONAA >60 07/23/2012  Hepatic Function Markers Lab Results  Component Value Date   AST 29 07/23/2012   ALT 29 07/23/2012   ALBUMIN 4.3 07/23/2012   ALKPHOS 76 07/23/2012   LIPASE 248 07/23/2012                        Electrolytes Lab Results  Component Value Date   NA 139 07/23/2012   K 3.7 07/23/2012   CL 108 (H) 07/23/2012   CALCIUM 8.6 07/23/2012                        Neuropathy Markers No results found for: VITAMINB12, FOLATE, HGBA1C, HIV                      CNS Tests No results found for: COLORCSF, APPEARCSF, RBCCOUNTCSF, WBCCSF, POLYSCSF, LYMPHSCSF, EOSCSF, PROTEINCSF, GLUCCSF, JCVIRUS, CSFOLI, IGGCSF                      Bone Pathology Markers No results found for: VD25OH, [id number], XI5038UE2, CM0349ZP9, 25OHVITD1, 25OHVITD2, 25OHVITD3, TESTOFREE, TESTOSTERONE                       Coagulation Parameters Lab Results  Component Value Date    PLT 237 07/23/2012                        Cardiovascular Markers Lab Results  Component Value Date   HGB 15.0 07/23/2012   HCT 43.3 07/23/2012                         CA Markers No results found for: CEA, CA125, LABCA2                      Note: Lab results reviewed.  [place]  Drug: Ms. Estis  reports that she does not use drugs. Alcohol:  reports that she does not drink alcohol. Tobacco:  reports that she has been smoking cigarettes. She has a 3.50 pack-year smoking history. She has never used smokeless tobacco. Medical:  has no past medical history on file. Family: family history includes Diabetes in her father.  Past Surgical History:  Procedure Laterality Date  . KNEE SURGERY    . KNEE SURGERY Left 2012  . MASTOIDECTOMY  1997  . RHINOPLASTY    . TUBAL LIGATION     Active Ambulatory Problems    Diagnosis Date Noted  . Obesity (BMI 30-39.9) 02/02/2018  . History of arthroscopy of left knee 10/27/2017   Resolved Ambulatory Problems    Diagnosis Date Noted  . No Resolved Ambulatory Problems   No Additional Past Medical History   Constitutional Exam  General appearance: Well nourished, well developed, and well hydrated. In no apparent acute distress Vitals:   02/02/18 0829 02/02/18 0830  BP:  110/64  Pulse:  83  Resp:  16  Temp:  98.5 F (36.9 C)  SpO2:  100%  Weight: 179 lb 8 oz (81.4 kg)   Height: _0  (1.575 m)    BMI Assessment: Estimated body mass index is 32.83 kg/m as calculated from the following:   Height as of this encounter: _1  (1.575 m).   Weight as of this encounter: 179 lb 8 oz (81.4 kg).  BMI interpretation table: BMI level Category Range association with higher incidence of chronic pain  <18 kg/m2  Underweight   18.5-24.9 kg/m2 Ideal body weight   25-29.9 kg/m2 Overweight Increased incidence by 20%  30-34.9 kg/m2 Obese (Class I) Increased incidence by 68%  35-39.9 kg/m2 Severe obesity (Class II) Increased incidence by 136%  >40 kg/m2  Extreme obesity (Class III) Increased incidence by 254%   Patient's current BMI Ideal Body weight  Body mass index is 32.83 kg/m. Ideal body weight: 50.1 kg (110 lb 7.2 oz) Adjusted ideal body weight: 62.6 kg (138 lb 1.1 oz)   BMI Readings from Last 4 Encounters:  02/02/18 32.83 kg/m  10/10/15 31.83 kg/m  12/03/14 28.34 kg/m   Wt Readings from Last 4 Encounters:  02/02/18 179 lb 8 oz (81.4 kg)  10/10/15 174 lb (78.9 kg)  12/03/14 160 lb (72.6 kg)  Psych/Mental status: Alert, oriented x 3 (person, place, & time)       Eyes: PERLA Respiratory: No evidence of acute respiratory distress  Cervical Spine Area Exam  Skin & Axial Inspection: No masses, redness, edema, swelling, or associated skin lesions Alignment: Symmetrical Functional ROM: Unrestricted ROM      Stability: No instability detected Muscle Tone/Strength: Functionally intact. No obvious neuro-muscular anomalies detected. Sensory (Neurological): Unimpaired Palpation: No palpable anomalies              Upper Extremity (UE) Exam    Side: Right upper extremity  Side: Left upper extremity  Skin & Extremity Inspection: Skin color, temperature, and hair growth are WNL. No peripheral edema or cyanosis. No masses, redness, swelling, asymmetry, or associated skin lesions. No contractures.  Skin & Extremity Inspection: Skin color, temperature, and hair growth are WNL. No peripheral edema or cyanosis. No masses, redness, swelling, asymmetry, or associated skin lesions. No contractures.  Functional ROM: Unrestricted ROM          Functional ROM: Unrestricted ROM          Muscle Tone/Strength: Functionally intact. No obvious neuro-muscular anomalies detected.  Muscle Tone/Strength: Functionally intact. No obvious neuro-muscular anomalies detected.  Sensory (Neurological): Unimpaired          Sensory (Neurological): Unimpaired          Palpation: No palpable anomalies              Palpation: No palpable anomalies               Provocative Test(s):  Phalen's test: deferred Tinel's test: deferred Apley's scratch test (touch opposite shoulder):  Action 1 (Across chest): deferred Action 2 (Overhead): deferred Action 3 (LB reach): deferred   Provocative Test(s):  Phalen's test: deferred Tinel's test: deferred Apley's scratch test (touch opposite shoulder):  Action 1 (Across chest): deferred Action 2 (Overhead): deferred Action 3 (LB reach): deferred    Thoracic Spine Area Exam  Skin & Axial Inspection: No masses, redness, or swelling Alignment: Symmetrical Functional ROM: Unrestricted ROM Stability: No instability detected Muscle Tone/Strength: Functionally intact. No obvious neuro-muscular anomalies detected. Sensory (Neurological): Unimpaired Muscle strength & Tone: No palpable anomalies  Lumbar Spine Area Exam  Skin & Axial Inspection: No masses, redness, or swelling Alignment: Symmetrical Functional ROM: Unrestricted ROM       Stability: No instability detected Muscle Tone/Strength: Functionally intact. No obvious neuro-muscular anomalies detected. Sensory (Neurological): Unimpaired Palpation: No palpable anomalies       Provocative Tests: Hyperextension/rotation test: deferred today       Lumbar quadrant test (Kemp's test): deferred today       Lateral bending test: deferred today       Patrick's Maneuver:  deferred today                   FABER test: deferred today                   S-I anterior distraction/compression test: deferred today         S-I lateral compression test: deferred today         S-I Thigh-thrust test: deferred today         S-I Gaenslen's test: deferred today          Gait & Posture Assessment  Ambulation: Unassisted Gait: Relatively normal for age and body habitus Posture: WNL   Lower Extremity Exam    Side: Right lower extremity  Side: Left lower extremity  Stability: No instability observed          Stability: No instability observed          Skin & Extremity  Inspection: Skin color, temperature, and hair growth are WNL. No peripheral edema or cyanosis. No masses, redness, swelling, asymmetry, or associated skin lesions. No contractures.  Skin & Extremity Inspection: Evidence of prior arthroplastic surgery  Functional ROM: Unrestricted ROM                  Functional ROM: Decreased ROM for hip and knee joints          Muscle Tone/Strength: Functionally intact. No obvious neuro-muscular anomalies detected.  Muscle Tone/Strength: Grossly intact  Sensory (Neurological): Unimpaired  Sensory (Neurological): Musculoskeletal pain pattern  Palpation: No palpable anomalies  Palpation: No palpable anomalies   Assessment  Primary Diagnosis & Pertinent Problem List: The primary encounter diagnosis was Subchondral insufficiency fracture of condyle of left femur, sequela. Diagnoses of Chronic pain of left knee, Chronic pain syndrome, Obesity (BMI 30-39.9), and History of arthroscopy of left knee were also pertinent to this visit.  Visit Diagnosis (New problems to examiner): 1. Subchondral insufficiency fracture of condyle of left femur, sequela   2. Chronic pain of left knee   3. Chronic pain syndrome   4. Obesity (BMI 30-39.9)   5. History of arthroscopy of left knee   General Recommendations: The pain condition that the patient suffers from is best treated with a multidisciplinary approach that involves an increase in physical activity to prevent de-conditioning and worsening of the pain cycle, as well as psychological counseling (formal and/or informal) to address the co-morbid psychological affects of pain. Treatment will often involve judicious use of pain medications and interventional procedures to decrease the pain, allowing the patient to participate in the physical activity that will ultimately produce long-lasting pain reductions. The goal of the multidisciplinary approach is to return the patient to a higher level of overall function and to restore their  ability to perform activities of daily living.  27 year old female who presents with a chief complaint of left leg pain.  Of note patient sustained a fall in December 2018 and had injury to her right hip, right knee.  Her fall was related to her dog where she rotated her leg inward and felt a pop.  She was evaluated at the [facility] [place] ED, results of that evaluation are noted on 03/22/2017.  Patient also has a past medical history of IBS, prior knee arthroscopic surgery.  Regards to medication management, the patient has previously tried various NSAIDs including diclofenac, meloxicam, ibuprofen.  She has not tried any neuropathic medications including gabapentin or Lyrica.  Patient is specifically interested in opioid medication such as oxycodone and/or  Percocet.  Patient states that she has tried physical therapy, chiropractic therapy, steroid injections.  At this point, the patient can consider non-opioid analgesics however she will not be a candidate for chronic opioid therapy.  I explained to the patient how chronic opioid medications can result in side effects of constipation, tolerance, addiction and at her young age, I do not recommend this as a part of her chronic medication regimen.  Given that the patient has tried various NSAIDs and has not found them effective, it would be worthwhile to consider neuropathic analgesics including gabapentin, Lyrica, Cymbalta.  Risks and benefits of these medications were discussed and patient would like to try them.  Patient is high risk for opioid prescribing. The patient will not be a candidate for opioid therapy through this clinic. Patient will be optimized on non-opioid adjunctive analgesics and a comprehensive pain management plan was developed.  Pt would benefit from seeing a pain psychologist.  They are health psychologists who specialize treating only our patients who suffer from chronic pain.  They often have insight and contribute to treatment plan in  complementary and additive ways.  Offered to patient but she declined.   Plan of Care (Initial workup plan)  -Not a candidate for chronic opioid therapy -Trial of gabapentin as below -Consider sending to pain psychology in future if patient is interested for pain coping skills   Pharmacotherapy (current): Medications ordered:  Meds ordered this encounter  Medications  . gabapentin (NEURONTIN) 100 MG capsule    Sig: 100 mg qhs x 1 week, then 200 mg qhs for 1 week, then 300 mg qhs    Dispense:  132 capsule    Refill:  1    Do not place this medication, or any other prescription from our practice, on "Automatic Refill". Patient may have prescription filled one day early if pharmacy is closed on scheduled refill date.   Medications administered during this visit: Evalynne R. Tubby had no medications administered during this visit.   Pharmacological management options:  Opioid Analgesics: Not a candidate  Membrane stabilizer: Trial of gabapentin today.  Can consider Lyrica, Cymbalta in the future.  Muscle relaxant: To be determined at a later time  NSAID: Has already tried ibuprofen, Mobic, diclofenac, Aleve without significant benefit  Other analgesic(s): To be determined at a later time    Provider-requested follow-up: Return in about 6 weeks (around 03/16/2018) for MM with Crystal.  Future Appointments  Date Time Provider [place]  02/09/2018  8:45 AM [facility]  03/16/2018  9:00 AM Vevelyn Francois, NP [facility] None    Primary Care Physician: Deborah Lange, NP Location: [facility] Note by: Deborah Sanchez, M.D, Date: 02/02/2018; Time: 10:34 AM  Patient Instructions  You have gabapentin that has been sent to your pharmacy.

## 2018-02-09 ENCOUNTER — Ambulatory Visit
Admission: RE | Admit: 2018-02-09 | Discharge: 2018-02-09 | Disposition: A | Discharge status: Home / Self Care | Payer: Managed Care, Other (non HMO) | Source: Ambulatory Visit | Attending: Orthopedic Surgery | Admitting: Orthopedic Surgery

## 2018-02-09 DIAGNOSIS — S72425A Nondisplaced fracture of lateral condyle of left femur, initial encounter for closed fracture: Secondary | ICD-10-CM

## 2018-02-09 DIAGNOSIS — X58XXXD Exposure to other specified factors, subsequent encounter: Secondary | ICD-10-CM | POA: Diagnosis not present

## 2018-02-09 DIAGNOSIS — S8012XD Contusion of left lower leg, subsequent encounter: Secondary | ICD-10-CM | POA: Insufficient documentation

## 2018-02-09 DIAGNOSIS — S72425D Nondisplaced fracture of lateral condyle of left femur, subsequent encounter for closed fracture with routine healing: Secondary | ICD-10-CM | POA: Diagnosis not present

## 2018-03-15 DIAGNOSIS — J45909 Unspecified asthma, uncomplicated: Secondary | ICD-10-CM | POA: Insufficient documentation

## 2018-03-15 NOTE — Progress Notes (Deleted)
Patient's Name: Deborah Sanchez  MRN: [id number]  Referring Provider: Sallee Lange, *  DOB: 1990/06/07  PCP: Sallee Lange, NP  DOS: 03/16/2018  Note by: Vevelyn Francois NP  Service setting: Ambulatory outpatient  Specialty: Interventional Pain Management  Location: [facility] (AMB) Pain Management Facility    Patient type: Established    Primary Reason(s) for Visit: Encounter for prescription drug management. (Level of risk: moderate)  CC: No chief complaint on file.  HPI  Deborah Sanchez is a 27 y.o. year old, female patient, who comes today for a medication management evaluation. She has Obesity (BMI 30-39.9); History of arthroscopy of left knee; Subchondral insufficiency fracture of condyle of left femur ([place]); Chronic pain of left knee; Chronic pain syndrome; and Asthma without status asthmaticus on their problem list. Her primarily concern today is the No chief complaint on file.  Pain Assessment: Location:     Radiating:   Onset:   Duration:   Quality:   Severity:  /10 (subjective, self-reported pain score)  Note: Reported level is compatible with observation.                         When using our objective Pain Scale, levels between 6 and 10/10 are said to belong in an emergency room, as it progressively worsens from a 6/10, described as severely limiting, requiring emergency care not usually available at an outpatient pain management facility. At a 6/10 level, communication becomes difficult and requires great effort. Assistance to reach the emergency department may be required. Facial flushing and profuse sweating along with potentially dangerous increases in heart rate and blood pressure will be evident. Effect on ADL:   Timing:   Modifying factors:   BP:    HR:    Deborah Sanchez was last scheduled for an appointment on Visit date not found for medication management. During today's appointment we reviewed Deborah Sanchez's chronic pain status, as well as her outpatient medication  regimen.  The patient  reports that she does not use drugs. Her body mass index is unknown because there is no height or weight on file.  Further details on both, my assessment(s), as well as the proposed treatment plan, please see below.  Controlled Substance Pharmacotherapy Assessment REMS (Risk Evaluation and Mitigation Strategy)  Analgesic: *** MME/day: *** mg/day.  No notes on file Pharmacokinetics: Liberation and absorption (onset of action): WNL Distribution (time to peak effect): WNL Metabolism and excretion (duration of action): WNL         Pharmacodynamics: Desired effects: Analgesia: Deborah Sanchez reports >50% benefit. Functional ability: Patient reports that medication allows her to accomplish basic ADLs Clinically meaningful improvement in function (CMIF): Sustained CMIF goals met Perceived effectiveness: Described as relatively effective, allowing for increase in activities of daily living (ADL) Undesirable effects: Side-effects or Adverse reactions: None reported Monitoring:  PMP: Online review of the past 74-monthperiod conducted. Compliant with practice rules and regulations Last UDS on record: No results found for: SUMMARY UDS interpretation: Compliant          Medication Assessment Form: Reviewed. Patient indicates being compliant with therapy Treatment compliance: Compliant Risk Assessment Profile: Aberrant behavior: See prior evaluations. None observed or detected today Comorbid factors increasing risk of overdose: See prior notes. No additional risks detected today Opioid risk tool (ORT) (Total Score):   Personal History of Substance Abuse (SUD-Substance use disorder):  Alcohol:    Illegal Drugs:    Rx Drugs:  ORT Risk Level calculation:   Risk of substance use disorder (SUD): Low  ORT Scoring interpretation table:  Score <3 = Low Risk for SUD  Score between 4-7 = Moderate Risk for SUD  Score >8 = High Risk for Opioid Abuse   Risk Mitigation  Strategies:  Patient Counseling: Covered Patient-Prescriber Agreement (PPA): Present and active  Notification to other healthcare providers: Done  Pharmacologic Plan: No change in therapy, at this time.             Laboratory Chemistry  Inflammation Markers (CRP: Acute Phase) (ESR: Chronic Phase) No results found for: CRP, ESRSEDRATE, LATICACIDVEN                       Rheumatology Markers No results found for: RF, ANA, LABURIC, URICUR, LYMEIGGIGMAB, LYMEABIGMQN, HLAB27                      Renal Function Markers Lab Results  Component Value Date   BUN 12 07/23/2012   CREATININE 0.57 (L) 07/23/2012   GFRAA >60 07/23/2012   GFRNONAA >60 07/23/2012                             Hepatic Function Markers Lab Results  Component Value Date   AST 29 07/23/2012   ALT 29 07/23/2012   ALBUMIN 4.3 07/23/2012   ALKPHOS 76 07/23/2012   LIPASE 248 07/23/2012                        Electrolytes Lab Results  Component Value Date   NA 139 07/23/2012   K 3.7 07/23/2012   CL 108 (H) 07/23/2012   CALCIUM 8.6 07/23/2012                        Neuropathy Markers No results found for: VITAMINB12, FOLATE, HGBA1C, HIV                      CNS Tests No results found for: COLORCSF, APPEARCSF, RBCCOUNTCSF, WBCCSF, POLYSCSF, LYMPHSCSF, EOSCSF, PROTEINCSF, GLUCCSF, JCVIRUS, CSFOLI, IGGCSF                      Bone Pathology Markers No results found for: VD25OH, AC166AY3KZS, WF0932TF5, DD2202RK2, 25OHVITD1, 25OHVITD2, 25OHVITD3, TESTOFREE, TESTOSTERONE                       Coagulation Parameters Lab Results  Component Value Date   PLT 237 07/23/2012                        Cardiovascular Markers Lab Results  Component Value Date   HGB 15.0 07/23/2012   HCT 43.3 07/23/2012                         CA Markers No results found for: CEA, CA125, LABCA2                      Note: Lab results reviewed.  Recent Diagnostic Imaging Results  MR KNEE LEFT WO CONTRAST CLINICAL DATA:  Left  knee pain since July.  EXAM: MRI OF THE LEFT KNEE WITHOUT CONTRAST  TECHNIQUE: Multiplanar, multisequence MR imaging of the knee was performed. No intravenous contrast was administered.  COMPARISON:  11/06/2017  FINDINGS:  MENISCI  Medial meniscus: Increased signal in the periphery of the posterior horn of the medial meniscus consistent with degeneration similar in appearance to the prior exam. No discrete tear.  Lateral meniscus:  Intact.  LIGAMENTS  Cruciates:  Intact ACL and PCL.  Collaterals: Medial collateral ligament is intact. Lateral collateral ligament complex is intact.  CARTILAGE  Patellofemoral:  No chondral defect.  Medial:  No chondral defect.  Lateral:  No chondral defect.  Joint: No joint effusion. Normal Hoffa's fat. No plical thickening.  Popliteal Fossa:  No Baker's cyst.  Intact popliteus tendon.  Extensor Mechanism: Intact quadriceps tendon. Intact patellar tendon. Intact medial patellar retinaculum. Intact lateral patellar retinaculum. Intact MPFL.  Bones:  No acute osseous abnormality.  No aggressive osseous lesion.  Other: No fluid collection or hematoma.  IMPRESSION: 1. No discrete meniscal or ligamentous injury of left knee. 2. Interval resolution of osseous contusions of the anterior lateral femoral condyle and lateral tibial plateau. Interval healing of the impaction fracture of the anterolateral femoral condyle. Interval resolution of osseous contusion of the anterior medial tibial plateau and to a lesser extent anteromedial tibial plateau. These were likely secondary to a hyperextension injury.  Electronically Signed   By: Kathreen Devoid   On: 02/09/2018 10:59  Complexity Note: Imaging results reviewed. Results shared with Ms. Mickler, using Layman's terms.                         Meds   Current Outpatient Medications:  .  calcitonin, salmon, (MIACALCIN/FORTICAL) 200 UNIT/ACT nasal spray, U 1 SPRAY IN LEFT NOSTRIL D, Disp: ,  Rfl: 11 .  gabapentin (NEURONTIN) 100 MG capsule, 100 mg qhs x 1 week, then 200 mg qhs for 1 week, then 300 mg qhs, Disp: 132 capsule, Rfl: 1 .  naproxen (NAPROSYN) 500 MG tablet, Take 1 tablet (500 mg total) by mouth 2 (two) times daily with a meal. (Patient not taking: Reported on 02/02/2018), Disp: 60 tablet, Rfl: 0 .  norgestimate-ethinyl estradiol (ORTHO-CYCLEN,SPRINTEC,PREVIFEM) 0.25-35 MG-MCG tablet, Take 1 tablet by mouth daily., Disp: , Rfl:  .  omeprazole (PRILOSEC) 40 MG capsule, Take by mouth., Disp: , Rfl:  .  oxyCODONE (OXY IR/ROXICODONE) 5 MG immediate release tablet, TK 1 TO 2 TS PO Q 8 H PRN P, Disp: , Rfl: 0 .  sertraline (ZOLOFT) 25 MG tablet, Take 25 mg by mouth daily., Disp: , Rfl:  .  traMADol (ULTRAM) 50 MG tablet, TK 1 T PO Q 6 H PRN P, Disp: , Rfl: 1  ROS  Constitutional: Denies any fever or chills Gastrointestinal: No reported hemesis, hematochezia, vomiting, or acute GI distress Musculoskeletal: Denies any acute onset joint swelling, redness, loss of ROM, or weakness Neurological: No reported episodes of acute onset apraxia, aphasia, dysarthria, agnosia, amnesia, paralysis, loss of coordination, or loss of consciousness  Allergies  Ms. Monaco has No Known Allergies.  [place]  Drug: Ms. Montagna  reports that she does not use drugs. Alcohol:  reports that she does not drink alcohol. Tobacco:  reports that she has been smoking cigarettes. She has a 3.50 pack-year smoking history. She has never used smokeless tobacco. Medical:  has no past medical history on file. Surgical: Ms. Wasser  has a past surgical history that includes Knee surgery; Rhinoplasty; Mastoidectomy (1997); Knee surgery (Left, 2012); and Tubal ligation. Family: family history includes Diabetes in her father.  Constitutional Exam  General appearance: Well nourished, well developed, and well hydrated. In no  apparent acute distress There were no vitals filed for this visit. BMI Assessment: Estimated body  mass index is 32.83 kg/m as calculated from the following:   Height as of 02/02/18: 5' 2"  (1.575 m).   Weight as of 02/02/18: 179 lb 8 oz (81.4 kg).  BMI interpretation table: BMI level Category Range association with higher incidence of chronic pain  <18 kg/m2 Underweight   18.5-24.9 kg/m2 Ideal body weight   25-29.9 kg/m2 Overweight Increased incidence by 20%  30-34.9 kg/m2 Obese (Class I) Increased incidence by 68%  35-39.9 kg/m2 Severe obesity (Class II) Increased incidence by 136%  >40 kg/m2 Extreme obesity (Class III) Increased incidence by 254%   Patient's current BMI Ideal Body weight  There is no height or weight on file to calculate BMI. Patient weight not recorded   BMI Readings from Last 4 Encounters:  02/02/18 32.83 kg/m  10/10/15 31.83 kg/m  12/03/14 28.34 kg/m   Wt Readings from Last 4 Encounters:  02/02/18 179 lb 8 oz (81.4 kg)  10/10/15 174 lb (78.9 kg)  12/03/14 160 lb (72.6 kg)  Psych/Mental status: Alert, oriented x 3 (person, place, & time)       Eyes: PERLA Respiratory: No evidence of acute respiratory distress  Cervical Spine Area Exam  Skin & Axial Inspection: No masses, redness, edema, swelling, or associated skin lesions Alignment: Symmetrical Functional ROM: Unrestricted ROM      Stability: No instability detected Muscle Tone/Strength: Functionally intact. No obvious neuro-muscular anomalies detected. Sensory (Neurological): Unimpaired Palpation: No palpable anomalies              Upper Extremity (UE) Exam    Side: Right upper extremity  Side: Left upper extremity  Skin & Extremity Inspection: Skin color, temperature, and hair growth are WNL. No peripheral edema or cyanosis. No masses, redness, swelling, asymmetry, or associated skin lesions. No contractures.  Skin & Extremity Inspection: Skin color, temperature, and hair growth are WNL. No peripheral edema or cyanosis. No masses, redness, swelling, asymmetry, or associated skin lesions. No  contractures.  Functional ROM: Unrestricted ROM          Functional ROM: Unrestricted ROM          Muscle Tone/Strength: Functionally intact. No obvious neuro-muscular anomalies detected.  Muscle Tone/Strength: Functionally intact. No obvious neuro-muscular anomalies detected.  Sensory (Neurological): Unimpaired          Sensory (Neurological): Unimpaired          Palpation: No palpable anomalies              Palpation: No palpable anomalies              Provocative Test(s):  Phalen's test: deferred Tinel's test: deferred Apley's scratch test (touch opposite shoulder):  Action 1 (Across chest): deferred Action 2 (Overhead): deferred Action 3 (LB reach): deferred   Provocative Test(s):  Phalen's test: deferred Tinel's test: deferred Apley's scratch test (touch opposite shoulder):  Action 1 (Across chest): deferred Action 2 (Overhead): deferred Action 3 (LB reach): deferred    Thoracic Spine Area Exam  Skin & Axial Inspection: No masses, redness, or swelling Alignment: Symmetrical Functional ROM: Unrestricted ROM Stability: No instability detected Muscle Tone/Strength: Functionally intact. No obvious neuro-muscular anomalies detected. Sensory (Neurological): Unimpaired Muscle strength & Tone: No palpable anomalies  Lumbar Spine Area Exam  Skin & Axial Inspection: No masses, redness, or swelling Alignment: Symmetrical Functional ROM: Unrestricted ROM       Stability: No instability detected Muscle Tone/Strength: Functionally intact. No  obvious neuro-muscular anomalies detected. Sensory (Neurological): Unimpaired Palpation: No palpable anomalies       Provocative Tests: Hyperextension/rotation test: deferred today       Lumbar quadrant test (Kemp's test): deferred today       Lateral bending test: deferred today       Patrick's Maneuver: deferred today                   FABER test: deferred today                   S-I anterior distraction/compression test: deferred today          S-I lateral compression test: deferred today         S-I Thigh-thrust test: deferred today         S-I Gaenslen's test: deferred today          Gait & Posture Assessment  Ambulation: Unassisted Gait: Relatively normal for age and body habitus Posture: WNL   Lower Extremity Exam    Side: Right lower extremity  Side: Left lower extremity  Stability: No instability observed          Stability: No instability observed          Skin & Extremity Inspection: Skin color, temperature, and hair growth are WNL. No peripheral edema or cyanosis. No masses, redness, swelling, asymmetry, or associated skin lesions. No contractures.  Skin & Extremity Inspection: Skin color, temperature, and hair growth are WNL. No peripheral edema or cyanosis. No masses, redness, swelling, asymmetry, or associated skin lesions. No contractures.  Functional ROM: Unrestricted ROM                  Functional ROM: Unrestricted ROM                  Muscle Tone/Strength: Functionally intact. No obvious neuro-muscular anomalies detected.  Muscle Tone/Strength: Functionally intact. No obvious neuro-muscular anomalies detected.  Sensory (Neurological): Unimpaired        Sensory (Neurological): Unimpaired        DTR: Patellar: deferred today Achilles: deferred today Plantar: deferred today  DTR: Patellar: deferred today Achilles: deferred today Plantar: deferred today  Palpation: No palpable anomalies  Palpation: No palpable anomalies   Assessment  Primary Diagnosis & Pertinent Problem List: There were no encounter diagnoses.  Status Diagnosis  Controlled Controlled Controlled No diagnosis found.  Problems updated and reviewed during this visit: Problem  Asthma Without Status Asthmaticus   Plan of Care  Pharmacotherapy (Medications Ordered): No orders of the defined types were placed in this encounter.  New Prescriptions   No medications on file   Medications administered today: Aliayah R. Ricchio had no  medications administered during this visit. Lab-work, procedure(s), and/or referral(s): No orders of the defined types were placed in this encounter.  Imaging and/or referral(s): None  Interventional therapies: Planned, scheduled, and/or pending:   Not at this time.   Considering:   ***   Palliative PRN treatment(s):   ***   Provider-requested follow-up: No follow-ups on file.  Future Appointments  Date Time Provider [place]  03/16/2018  9:00 AM Vevelyn Francois, NP [facility] None   Primary Care Physician: Sallee Lange, NP Location: [facility] Note by: Vevelyn Francois NP Date: 03/16/2018; Time: 2:34 PM  Pain Score Disclaimer: We use the NRS-11 scale. This is a self-reported, subjective measurement of pain severity with only modest accuracy. It is used primarily to identify changes within  a particular patient. It must be understood that outpatient pain scales are significantly less accurate that those used for research, where they can be applied under ideal controlled circumstances with minimal exposure to variables. In reality, the score is likely to be a combination of pain intensity and pain affect, where pain affect describes the degree of emotional arousal or changes in action readiness caused by the sensory experience of pain. Factors such as social and work situation, setting, emotional state, anxiety levels, expectation, and prior pain experience may influence pain perception and show large inter-individual differences that may also be affected by time variables.  Patient instructions provided during this appointment: There are no Patient Instructions on file for this visit.

## 2018-03-16 ENCOUNTER — Encounter: Payer: Managed Care, Other (non HMO) | Admitting: Nurse Practitioner

## 2018-04-08 ENCOUNTER — Encounter: Payer: Self-pay | Admitting: Emergency Medicine

## 2018-04-08 ENCOUNTER — Other Ambulatory Visit: Payer: Self-pay

## 2018-04-08 ENCOUNTER — Ambulatory Visit
Admission: EM | Admit: 2018-04-08 | Discharge: 2018-04-08 | Disposition: A | Discharge status: Home / Self Care | Payer: Managed Care, Other (non HMO) | Attending: Family Medicine | Admitting: Family Medicine

## 2018-04-08 ENCOUNTER — Ambulatory Visit (INDEPENDENT_AMBULATORY_CARE_PROVIDER_SITE_OTHER): Payer: Managed Care, Other (non HMO)

## 2018-04-08 DIAGNOSIS — W11XXXA Fall on and from ladder, initial encounter: Secondary | ICD-10-CM | POA: Diagnosis not present

## 2018-04-08 DIAGNOSIS — S86311A Strain of muscle(s) and tendon(s) of peroneal muscle group at lower leg level, right leg, initial encounter: Secondary | ICD-10-CM | POA: Diagnosis not present

## 2018-04-08 MED ORDER — MELOXICAM 15 MG PO TABS: 15.0000 mg | ORAL_TABLET | Freq: Every day | ORAL | 0 refills | Status: DC

## 2018-04-08 NOTE — ED Triage Notes (Signed)
Pt c/o right foot pain. She reports that she stepped off a ladder about 2 weeks ago and started having pain the day after the injury.

## 2018-04-08 NOTE — ED Provider Notes (Signed)
[facility]    CSN: [id number] Arrival date & time: 04/08/18  1144     History   Chief Complaint Chief Complaint  Patient presents with  . Foot Pain    right    HPI Barrington EllisonKaitlyn Ruth Rosenstock is a 27 y.o. female.   HPI  Female presents with right lateral foot pain.  She states that 2 weeks ago at work she had stepped off a ladder missed a step and fell approximately 3 to 4 feet landing on her right foot.  She does not remember if she twisted her ankle.  He started having pain the day after the injury.  She had also complained of pain in her left lower back and hip but that has since subsided.  Now states that whenever she is on her feet for any prolonged period that the pain intensifies this is usually towards the evening.  Pain is localized over the inferior lateral malleolus and into the base of the fifth metatarsal and plantarly.        History reviewed. No pertinent past medical history.  Patient Active Problem List   Diagnosis Date Noted  . Asthma without status asthmaticus 03/15/2018  . Obesity (BMI 30-39.9) 02/02/2018  . Subchondral insufficiency fracture of condyle of left femur (HCC) 02/02/2018  . Chronic pain of left knee 02/02/2018  . Chronic pain syndrome 02/02/2018  . History of arthroscopy of left knee 10/27/2017    Past Surgical History:  Procedure Laterality Date  . KNEE SURGERY    . KNEE SURGERY Left 2012  . MASTOIDECTOMY  1997  . RHINOPLASTY    . TUBAL LIGATION      OB History   No obstetric history on file.      Home Medications    Prior to Admission medications   Medication Sig Start Date End Date Taking? Authorizing Provider  gabapentin (NEURONTIN) 100 MG capsule 100 mg qhs x 1 week, then 200 mg qhs for 1 week, then 300 mg qhs 02/02/18  Yes Lateef, Roselie SkinnerBilal, MD  meloxicam (MOBIC) 15 MG tablet Take 1 tablet (15 mg total) by mouth daily. 04/08/18   Lutricia Feiloemer, Larenda Reedy P, PA-C    Family History Family History  Problem Relation Age of  Onset  . Diabetes Father     Social History Social History   Tobacco Use  . Smoking status: Current Every Day Smoker    Packs/day: 0.50    Years: 7.00    Pack years: 3.50    Types: Cigarettes  . Smokeless tobacco: Never Used  Substance Use Topics  . Alcohol use: No    Alcohol/week: 0.0 standard drinks  . Drug use: No     Allergies   Patient has no known allergies.   Review of Systems Review of Systems  Constitutional: Positive for activity change. Negative for appetite change, chills, fatigue and fever.  Musculoskeletal: Positive for gait problem and myalgias.  All other systems reviewed and are negative.    Physical Exam Triage Vital Signs ED Triage Vitals  Enc Vitals Group     BP 04/08/18 1211 108/79     Pulse Rate 04/08/18 1211 97     Resp 04/08/18 1211 18     Temp 04/08/18 1211 97.9 F (36.6 C)     Temp Source 04/08/18 1211 Oral     SpO2 04/08/18 1211 98 %     Weight 04/08/18 1208 176 lb (79.8 kg)     Height 04/08/18 1208 5\' 2"  (1.575 m)  Head Circumference --      Peak Flow --      Pain Score 04/08/18 1208 6     Pain Loc --      Pain Edu? --      Excl. in GC? --    No data found.  Updated Vital Signs BP 108/79 (BP Location: Left Arm)   Pulse 97   Temp 97.9 F (36.6 C) (Oral)   Resp 18   Ht 5\' 2"  (1.575 m)   Wt 176 lb (79.8 kg)   SpO2 98%   BMI 32.19 kg/m   Visual Acuity Right Eye Distance:   Left Eye Distance:   Bilateral Distance:    Right Eye Near:   Left Eye Near:    Bilateral Near:     Physical Exam Vitals signs and nursing note reviewed.  Constitutional:      General: She is not in acute distress.    Appearance: Normal appearance. She is obese. She is not ill-appearing, toxic-appearing or diaphoretic.  HENT:     Head: Normocephalic.     Nose: Nose normal.     Mouth/Throat:     Mouth: Mucous membranes are moist.  Eyes:     General:        Right eye: No discharge.        Left eye: No discharge.      Conjunctiva/sclera: Conjunctivae normal.  Neck:     Musculoskeletal: Normal range of motion. No neck rigidity.  Musculoskeletal:     Comments: Examination of the right ankle and foot shows a mild decrease range of motion particularly to dorsiflexion and plantar flexion.  Subtalar motion is intact.  There is mild swelling over the lateral midfoot over the third and fourth metatarsal bases.  Patient has mild tenderness to compression of the distal fibula and tibia.  She has no tenderness over the lateral or medial malleolus.  However inferior to lateral malleolus and extending into the base of the fifth metatarsal particularly on the plantar surface is her maximal tenderness.  There is no crepitus present.  Distal neuro vascular function is intact  Skin:    General: Skin is warm and dry.  Neurological:     General: No focal deficit present.     Mental Status: She is alert and oriented to person, place, and time.  Psychiatric:        Mood and Affect: Mood normal.        Behavior: Behavior normal.        Thought Content: Thought content normal.        Judgment: Judgment normal.      UC Treatments / Results  Labs (all labs ordered are listed, but only abnormal results are displayed) Labs Reviewed - No data to display  EKG None  Radiology Dg Foot Complete Right  Result Date: 04/08/2018 CLINICAL DATA:  Right foot pain since the patient injured it on a ladder 2 weeks ago. Initial encounter. EXAM: RIGHT FOOT COMPLETE - 3+ VIEW COMPARISON:  None. FINDINGS: There is no evidence of fracture or dislocation. There is no evidence of arthropathy or other focal bone abnormality. Soft tissues are unremarkable. IMPRESSION: Negative exam. Electronically Signed   By: Drusilla Kanner M.D.   On: 04/08/2018 12:35    Procedures Procedures (including critical care time)  Medications Ordered in UC Medications - No data to display  Initial Impression / Assessment and Plan / UC Course  I have reviewed  the triage vital signs and the  nursing notes.  Pertinent labs & imaging results that were available during my care of the patient were reviewed by me and considered in my medical decision making (see chart for details).   I reviewed the x-rays with the patient showing no fracture seen.  However on examination she has a strain of the peroneal tendon at its insertion on the base of the fifth metatarsal.  You need to elevate and ice and avoid symptoms as much as possible.  We will provide her with a ankle splint for stability and I gave her a postop shoe for better support.  We will give her a note for work 2 days.  Also provide her with Mobic 15 mg she will take daily with food.  She is not improving she should follow-up with podiatry.   Final Clinical Impressions(s) / UC Diagnoses   Final diagnoses:  Strain of peroneal tendon of right foot, initial encounter     Discharge Instructions     Apply ice 20 minutes out of every 2 hours 4-5 times daily for comfort.    ED Prescriptions    Medication Sig Dispense Auth. Provider   meloxicam (MOBIC) 15 MG tablet Take 1 tablet (15 mg total) by mouth daily. 30 tablet Lutricia Feiloemer, Rennae Ferraiolo P, PA-C     Controlled Substance Prescriptions [place] Controlled Substance Registry consulted? Not Applicable   Lutricia FeilRoemer, Bjorn Hallas P, PA-C 04/08/18 1304

## 2018-04-08 NOTE — Discharge Instructions (Signed)
Apply ice 20 minutes out of every 2 hours 4-5 times daily for comfort.  °

## 2018-05-04 ENCOUNTER — Encounter: Payer: Self-pay | Admitting: Emergency Medicine

## 2018-05-04 ENCOUNTER — Other Ambulatory Visit: Payer: Self-pay

## 2018-05-04 ENCOUNTER — Emergency Department
Admission: EM | Admit: 2018-05-04 | Discharge: 2018-05-04 | Disposition: A | Discharge status: Home / Self Care | Payer: Managed Care, Other (non HMO) | Attending: Student in an Organized Health Care Education/Training Program | Admitting: Student in an Organized Health Care Education/Training Program

## 2018-05-04 DIAGNOSIS — L0291 Cutaneous abscess, unspecified: Secondary | ICD-10-CM | POA: Diagnosis present

## 2018-05-04 DIAGNOSIS — L02415 Cutaneous abscess of right lower limb: Secondary | ICD-10-CM | POA: Insufficient documentation

## 2018-05-04 DIAGNOSIS — F17210 Nicotine dependence, cigarettes, uncomplicated: Secondary | ICD-10-CM | POA: Insufficient documentation

## 2018-05-04 MED ORDER — SULFAMETHOXAZOLE-TRIMETHOPRIM 800-160 MG PO TABS: 1.0000 | ORAL_TABLET | Freq: Two times a day (BID) | ORAL | 0 refills | Status: DC

## 2018-05-04 MED ORDER — HYDROCODONE-ACETAMINOPHEN 5-325 MG PO TABS: 1.0000 | ORAL_TABLET | Freq: Four times a day (QID) | ORAL | 0 refills | Status: DC | PRN

## 2018-05-04 NOTE — ED Triage Notes (Signed)
Patient complaining of "boil" right side of "where my lip ends and my butt begins" X 2 - 3 days.  Describes it as golf ball size.  States area comes and goes.

## 2018-05-04 NOTE — ED Notes (Signed)
See triage note  Presents with possible abscess area to groin    States area has been there for some time and changes in intensity

## 2018-05-04 NOTE — Discharge Instructions (Addendum)
Keep your appointment with the dermatologist on Thursday.  Begin taking antibiotics twice a day as directed.  Continue taking your tramadol at home.  Use warm moist compresses frequently.  Return to the emergency department if any worsening of your symptoms or should the area become mushy like a rotten tomatoes.  Your prescription was sent to [facility] in [place].

## 2018-05-04 NOTE — ED Provider Notes (Signed)
[facility] Emergency Department Provider Note   ____________________________________________   First MD Initiated Contact with Patient 05/04/18 1103     (approximate)  I have reviewed the triage vital signs and the nursing notes.   HISTORY  Chief Complaint Recurrent Skin Infections   HPI Deborah Sanchez is a 28 y.o. female presents to the ED with complaint of possible boil/abscess that reoccurs in frequently but is present and painful.  Patient states that she has an appointment with a dermatologist on Thursday but is unable to wait due to the pain.  She had been taking tramadol which is not helped with the pain.  Denies any fever, nausea or vomiting with this.  Currently she rates her pain as 10/10.   History reviewed. No pertinent past medical history.  Patient Active Problem List   Diagnosis Date Noted  . Asthma without status asthmaticus 03/15/2018  . Obesity (BMI 30-39.9) 02/02/2018  . Subchondral insufficiency fracture of condyle of left femur (HCC) 02/02/2018  . Chronic pain of left knee 02/02/2018  . Chronic pain syndrome 02/02/2018  . History of arthroscopy of left knee 10/27/2017    Past Surgical History:  Procedure Laterality Date  . KNEE SURGERY    . KNEE SURGERY Left 2012  . MASTOIDECTOMY  1997  . RHINOPLASTY    . TUBAL LIGATION      Prior to Admission medications   Medication Sig Start Date End Date Taking? Authorizing Provider  HYDROcodone-acetaminophen (NORCO/VICODIN) 5-325 MG tablet Take 1 tablet by mouth every 6 (six) hours as needed for moderate pain. 05/04/18   Tommi Rumps, PA-C  meloxicam (MOBIC) 15 MG tablet Take 1 tablet (15 mg total) by mouth daily. 04/08/18   Lutricia Feil, PA-C  sulfamethoxazole-trimethoprim (BACTRIM DS,SEPTRA DS) 800-160 MG tablet Take 1 tablet by mouth 2 (two) times daily. 05/04/18   Tommi Rumps, PA-C    Allergies Patient has no known allergies.  Family History  Problem  Relation Age of Onset  . Diabetes Father     Social History Social History   Tobacco Use  . Smoking status: Current Every Day Smoker    Packs/day: 0.50    Years: 7.00    Pack years: 3.50    Types: Cigarettes  . Smokeless tobacco: Never Used  Substance Use Topics  . Alcohol use: No    Alcohol/week: 0.0 standard drinks  . Drug use: No    Review of Systems Constitutional: No fever/chills Cardiovascular: Denies chest pain. Respiratory: Denies shortness of breath. Musculoskeletal: Negative for back pain. Skin: Positive for questionable abscess. Neurological: Negative for headaches, focal weakness or numbness. ___________________________________________   PHYSICAL EXAM:  VITAL SIGNS: ED Triage Vitals  Enc Vitals Group     BP 05/04/18 0957 118/89     Pulse Rate 05/04/18 0957 100     Resp 05/04/18 0957 20     Temp 05/04/18 0957 98.1 F (36.7 C)     Temp Source 05/04/18 0957 Oral     SpO2 05/04/18 0957 98 %     Weight 05/04/18 0952 180 lb (81.6 kg)     Height 05/04/18 0952 5\' 2"  (1.575 m)     Head Circumference --      Peak Flow --      Pain Score 05/04/18 0952 10     Pain Loc --      Pain Edu? --      Excl. in GC? --    Constitutional: Alert and oriented. Well  appearing and in no acute distress. Eyes: Conjunctivae are normal.  Head: Atraumatic. Neck: No stridor.   Cardiovascular: Normal rate, regular rhythm. Grossly normal heart sounds.  Good peripheral circulation. Respiratory: Normal respiratory effort.  No retractions. Lungs CTAB. Musculoskeletal: No lower extremity tenderness nor edema.  No joint effusions. Neurologic:  Normal speech and language. No gross focal neurologic deficits are appreciated.  Skin:  Skin is warm, dry and intact.  There is one single erythematous firm lesion measuring approximately 2 cm in diameter that is extremely tender to palpation.  No evidence of drainage.  No fluctuant present. Psychiatric: Mood and affect are normal. Speech and  behavior are normal.  ____________________________________________   LABS (all labs ordered are listed, but only abnormal results are displayed)  Labs Reviewed - No data to display   PROCEDURES  Procedure(s) performed: None  Procedures  Critical Care performed: No  ____________________________________________   INITIAL IMPRESSION / ASSESSMENT AND PLAN / ED COURSE  As part of my medical decision making, I reviewed the following data within the electronic MEDICAL RECORD NUMBER Notes from prior ED visits and [place] Controlled Substance Database  Patient presents to the ED with complaint of right thigh abscess that has been intermittent for months.  She states that it started again.  She has an appointment with a dermatologist on Thursday.  She has been taking tramadol at home and is taken the last without any relief.  On exam there is a small single tender erythematous nodule with no fluctuant area.  Patient was encouraged to use moist heat to the area frequently.  She was placed on Bactrim DS twice daily for 10 days and Norco 1 every 6 hours as needed for pain.  She is to return to the emergency department if any severe worsening of her symptoms or to keep her appointment with the dermatologist on Thursday.  ____________________________________________   FINAL CLINICAL IMPRESSION(S) / ED DIAGNOSES  Final diagnoses:  Abscess of right thigh     ED Discharge Orders         Ordered    sulfamethoxazole-trimethoprim (BACTRIM DS,SEPTRA DS) 800-160 MG tablet  2 times daily     05/04/18 1211    HYDROcodone-acetaminophen (NORCO/VICODIN) 5-325 MG tablet  Every 6 hours PRN     05/04/18 1223           Note:  This document was prepared using Dragon voice recognition software and may include unintentional dictation errors.    Tommi Rumps, PA-C 05/04/18 1608    Willy Eddy, MD 05/04/18 [phone]

## 2018-09-08 ENCOUNTER — Ambulatory Visit (INDEPENDENT_AMBULATORY_CARE_PROVIDER_SITE_OTHER): Payer: Managed Care, Other (non HMO)

## 2018-09-08 ENCOUNTER — Other Ambulatory Visit: Payer: Self-pay

## 2018-09-08 ENCOUNTER — Encounter: Payer: Self-pay | Admitting: Podiatry

## 2018-09-08 ENCOUNTER — Ambulatory Visit (INDEPENDENT_AMBULATORY_CARE_PROVIDER_SITE_OTHER): Payer: Managed Care, Other (non HMO) | Admitting: Podiatry

## 2018-09-08 VITALS — Temp 97.3°F

## 2018-09-08 DIAGNOSIS — Q828 Other specified congenital malformations of skin: Secondary | ICD-10-CM | POA: Diagnosis not present

## 2018-09-08 DIAGNOSIS — M7751 Other enthesopathy of right foot: Secondary | ICD-10-CM

## 2018-09-08 DIAGNOSIS — L600 Ingrowing nail: Secondary | ICD-10-CM

## 2018-09-08 DIAGNOSIS — M779 Enthesopathy, unspecified: Secondary | ICD-10-CM | POA: Diagnosis not present

## 2018-09-08 DIAGNOSIS — M778 Other enthesopathies, not elsewhere classified: Secondary | ICD-10-CM

## 2018-09-08 MED ORDER — NEOMYCIN-POLYMYXIN-HC 1 % OT SOLN: OTIC | 1 refills | Status: DC

## 2018-09-08 MED ORDER — HYDROCODONE-ACETAMINOPHEN 10-325 MG PO TABS: 1.0000 | ORAL_TABLET | Freq: Three times a day (TID) | ORAL | 0 refills | Status: DC | PRN

## 2018-09-08 NOTE — Progress Notes (Signed)
  Subjective:  Patient ID: Deborah Sanchez, female    DOB: 10/04/1990,  MRN: [id number] HPI Chief Complaint  Patient presents with  . Toe Pain    5th toe right - tenderness around toenail and toe is red, rolled ankle few days ago and thinks toe got caught underneath, icing  . Toe Pain    Hallux left - lateral border, tender, red and swollen , online doc sent in antibiotic last night but hasn't picked up  . Foot Pain    5th MPJ left - tenderness, "feels like I'm getting a corn"  . New Patient (Initial Visit)    28 y.o. female presents with the above complaint.   ROS: Denies fever chills nausea vomiting muscle aches pains calf pain back pain chest pain shortness of breath.  No past medical history on file. Past Surgical History:  Procedure Laterality Date  . KNEE SURGERY    . KNEE SURGERY Left 2012  . MASTOIDECTOMY  1997  . RHINOPLASTY    . TUBAL LIGATION      Current Outpatient Medications:  .  HYDROcodone-acetaminophen (NORCO) 10-325 MG tablet, Take 1 tablet by mouth every 8 (eight) hours as needed., Disp: 5 tablet, Rfl: 0 .  NEOMYCIN-POLYMYXIN-HYDROCORTISONE (CORTISPORIN) 1 % SOLN OTIC solution, Apply 1-2 drops to toe BID after soaking, Disp: 10 mL, Rfl: 1  No Known Allergies Review of Systems Objective:   Vitals:   09/08/18 1434  Temp: (!) 97.3 F (36.3 C)    General: Well developed, nourished, in no acute distress, alert and oriented x3   Dermatological: Skin is warm, dry and supple bilateral. Nails x 10 are well maintained; remaining integument appears unremarkable at this time. There are no open sores, no preulcerative lesions, no rash or signs of infection present.  Vascular: Dorsalis Pedis artery and Posterior Tibial artery pedal pulses are 2/4 bilateral with immedate capillary fill time. Pedal hair growth present. No varicosities and no lower extremity edema present bilateral.   Neruologic: Grossly intact via light touch bilateral. Vibratory intact via  tuning fork bilateral. Protective threshold with Semmes Wienstein monofilament intact to all pedal sites bilateral. Patellar and Achilles deep tendon reflexes 2+ bilateral. No Babinski or clonus noted bilateral.   Musculoskeletal: No gross boney pedal deformities bilateral. No pain, crepitus, or limitation noted with foot and ankle range of motion bilateral. Muscular strength 5/5 in all groups tested bilateral.  She has mild tenderness on palpation of the anterior lateral ankle right mild tenderness with a small excoriation fifth digit right no signs of infection.  Gait: Unassisted, Nonantalgic.    Radiographs:  Radiographs taken today do not demonstrate any type of osseous abnormalities.  Assessment & Plan:   Assessment: Reactive hyperkeratotic lesion sub-fifth metatarsal of the left foot no open lesions or wounds ingrown toenail fibular border hallux left.  Plan: Debrided reactive hyperkeratotic tissue sub-fifth met head left chemical matricectomy was performed after local anesthetic was administered tolerated procedure well without complications.  She is provided both oral and written home-going structures for care and soaking of the toe as well as a prescription for Cortisporin Otic to be applied twice daily after soaking she was also given a prescription for narcotics for 5 tablets.     Yannet Rincon T. [place], [place]

## 2018-09-08 NOTE — Patient Instructions (Signed)

## 2018-09-29 ENCOUNTER — Ambulatory Visit: Payer: Managed Care, Other (non HMO) | Admitting: Podiatry

## 2019-12-22 ENCOUNTER — Other Ambulatory Visit: Payer: Self-pay

## 2019-12-22 DIAGNOSIS — Y998 Other external cause status: Secondary | ICD-10-CM | POA: Insufficient documentation

## 2019-12-22 DIAGNOSIS — S61012A Laceration without foreign body of left thumb without damage to nail, initial encounter: Secondary | ICD-10-CM | POA: Diagnosis present

## 2019-12-22 DIAGNOSIS — F17210 Nicotine dependence, cigarettes, uncomplicated: Secondary | ICD-10-CM | POA: Diagnosis not present

## 2019-12-22 DIAGNOSIS — Y9389 Activity, other specified: Secondary | ICD-10-CM | POA: Diagnosis not present

## 2019-12-22 DIAGNOSIS — J45909 Unspecified asthma, uncomplicated: Secondary | ICD-10-CM | POA: Diagnosis not present

## 2019-12-22 DIAGNOSIS — W268XXA Contact with other sharp object(s), not elsewhere classified, initial encounter: Secondary | ICD-10-CM | POA: Insufficient documentation

## 2019-12-22 DIAGNOSIS — Y92009 Unspecified place in unspecified non-institutional (private) residence as the place of occurrence of the external cause: Secondary | ICD-10-CM | POA: Insufficient documentation

## 2019-12-22 NOTE — ED Triage Notes (Signed)
Pt states she cut her left thumb on the anterior side of the thumb on a can lid at home tonight. Site is not bleeding at this time.

## 2019-12-23 ENCOUNTER — Emergency Department
Admission: EM | Admit: 2019-12-23 | Discharge: 2019-12-23 | Disposition: A | Discharge status: Home / Self Care | Payer: BC Managed Care – PPO | Attending: Emergency Medicine | Admitting: Emergency Medicine

## 2019-12-23 DIAGNOSIS — S61012A Laceration without foreign body of left thumb without damage to nail, initial encounter: Secondary | ICD-10-CM

## 2019-12-23 MED ORDER — LIDOCAINE HCL (PF) 1 % IJ SOLN
INTRAMUSCULAR | Status: AC
  Filled 2019-12-23: qty 5

## 2019-12-23 MED ORDER — LIDOCAINE HCL (PF) 1 % IJ SOLN: 5.0000 mL | Freq: Once | INTRAMUSCULAR | Status: DC

## 2019-12-23 NOTE — Discharge Instructions (Addendum)
Keep the wound clean and dry.  Use Tylenol or Motrin for pain.  Motrin can be up to 4 the over-the-counter pills 3 times a day with food.  Return for any signs of infection including redness pus increasing pain or swelling.  You can wash your hands tomorrow but dry them immediately and do not submerge them in standing water.  If you have to wear gloves removed in the soon as possible so your hand does not get sweaty.  Recheck the stitches in 2 days here or with your doctor.  Stitches out in a week to 10 days.  Try to avoid smoking as the smoking will impede wound healing.

## 2019-12-23 NOTE — Progress Notes (Signed)
Patient discharged home, discharge instructions reviewed. Patient did not have any questions. Gery Pray

## 2019-12-23 NOTE — ED Provider Notes (Signed)
[facility] Emergency Department Provider Note   ____________________________________________   First MD Initiated Contact with Patient 12/23/19 [phone]     (approximate)  I have reviewed the triage vital signs and the nursing notes.   HISTORY  Chief Complaint Laceration    HPI Deborah Sanchez is a 29 y.o. female who was opening a can of chicken soup when she cut her left thumb on the can lid.  She complains of a lot of pain there.  She was waiting for over 9 hours.  She is complaining of a lot of pain in the thumb radiating down into the thenar eminence area.  The laceration is on the distal phalanx of the thumb on the pad.  It is about 2 cm long.         History reviewed. No pertinent past medical history.  Patient Active Problem List   Diagnosis Date Noted  . Asthma without status asthmaticus 03/15/2018  . Obesity (BMI 30-39.9) 02/02/2018  . Subchondral insufficiency fracture of condyle of left femur (HCC) 02/02/2018  . Chronic pain of left knee 02/02/2018  . Chronic pain syndrome 02/02/2018  . History of arthroscopy of left knee 10/27/2017    Past Surgical History:  Procedure Laterality Date  . CHOLECYSTECTOMY    . KNEE SURGERY    . KNEE SURGERY Left 2012  . MASTOIDECTOMY  1997  . RHINOPLASTY    . TUBAL LIGATION      Prior to Admission medications   Medication Sig Start Date End Date Taking? Authorizing Provider  HYDROcodone-acetaminophen (NORCO) 10-325 MG tablet Take 1 tablet by mouth every 8 (eight) hours as needed. 09/08/18   Hyatt, Max T, DPM  NEOMYCIN-POLYMYXIN-HYDROCORTISONE (CORTISPORIN) 1 % SOLN OTIC solution Apply 1-2 drops to toe BID after soaking 09/08/18   Hyatt, Max T, DPM    Allergies Patient has no known allergies.  Family History  Problem Relation Age of Onset  . Diabetes Father     Social History Social History   Tobacco Use  . Smoking status: Current Every Day Smoker    Packs/day: 0.50    Years: 7.00     Pack years: 3.50    Types: Cigarettes  . Smokeless tobacco: Never Used  Vaping Use  . Vaping Use: Never used  Substance Use Topics  . Alcohol use: No    Alcohol/week: 0.0 standard drinks  . Drug use: No    Review of Systems  Constitutional: No fever/chills Eyes: No visual changes. ENT: No sore throat. Cardiovascular: Denies chest pain. Respiratory: Denies shortness of breath. Gastrointestinal: No abdominal pain.  No nausea, no vomiting.  No diarrhea.  No constipation. Genitourinary: Negative for dysuria. Musculoskeletal: Negative for back pain. Skin: Negative for rash. Neurological: Negative for headaches, focal weakness  ____________________________________________   PHYSICAL EXAM:  VITAL SIGNS: ED Triage Vitals  Enc Vitals Group     BP 12/22/19 2229 114/68     Pulse Rate 12/22/19 2229 72     Resp 12/22/19 2229 18     Temp 12/22/19 2229 98.5 F (36.9 C)     Temp Source 12/22/19 2229 Oral     SpO2 12/22/19 2229 99 %     Weight 12/22/19 2230 143 lb (64.9 kg)     Height 12/22/19 2230 5\' 2"  (1.575 m)     Head Circumference --      Peak Flow --      Pain Score 12/22/19 2242 10     Pain Loc --  Pain Edu? --      Excl. in GC? --     Constitutional: Alert and oriented.  Complaining of pain from the cat Eyes: Conjunctivae are normal.  Head: Atraumatic. Nose: No congestion/rhinnorhea. Mouth/Throat: Mucous membranes are moist.   Neck: No stridor.   Cardiovascular:Peri Jefferson peripheral circulation. Respiratory: Normal respiratory effort.  No retractions. Musculoskeletal: No lower extremity tenderness nor edema.   Neurologic:  Normal speech and language. No gross focal neurologic deficits are appreciated.  Skin:  Skin is warm, dry and intact. No rash noted. Psychiatric: Mood and affect are normal. Speech and behavior are normal.  ____________________________________________   LABS (all labs ordered are listed, but only abnormal results are displayed)  Labs  Reviewed - No data to display ____________________________________________  EKG   ____________________________________________  RADIOLOGY  ED MD interpretation:    Official radiology report(s): No results found.  ____________________________________________   PROCEDURES  Procedure(s) performed (including Critical Care) oral consent obtained thumb base cleaned with alcohol and then cleaned with Betadine.  Betadine was allowed to dry and then digital block was done.  After 25 minutes the returns the finger was almost numb but not completely.  About a half a cc was injected into the cut.  This hurts somewhat but the patient tolerated it well.  Wound was then irrigated with normal saline visualized to the base 1 small blood clot was removed reirrigated and closed with 8 stitches of 4-0 nylon.  Procedures   ____________________________________________   INITIAL IMPRESSION / ASSESSMENT AND PLAN / ED COURSE  Because the patient had the wound open for so long and is complaining of pain radiating up into the hand I will give her some antibiotics.  She will keep the thumb clean and dry use Tylenol or Motrin as needed for pain.  She will return for any signs of infection.      ____________________________________________   FINAL CLINICAL IMPRESSION(S) / ED DIAGNOSES  Final diagnoses:  Laceration of left thumb without foreign body without damage to nail, initial encounter     ED Discharge Orders    None      *Please note:  Bill Mcvey was evaluated in Emergency Department on 12/23/2019 for the symptoms described in the history of present illness. She was evaluated in the context of the global COVID-19 pandemic, which necessitated consideration that the patient might be at risk for infection with the SARS-CoV-2 virus that causes COVID-19. Institutional protocols and algorithms that pertain to the evaluation of patients at risk for COVID-19 are in a state of rapid change  based on information released by regulatory bodies including the CDC and federal and state organizations. These policies and algorithms were followed during the patient's care in the ED.  Some ED evaluations and interventions may be delayed as a result of limited staffing during and the pandemic.*   Note:  This document was prepared using Dragon voice recognition software and may include unintentional dictation errors.    Arnaldo Natal, MD 12/23/19 [phone]

## 2020-10-10 ENCOUNTER — Other Ambulatory Visit (HOSPITAL_COMMUNITY)
Admission: RE | Admit: 2020-10-10 | Discharge: 2020-10-10 | Disposition: A | Discharge status: Home / Self Care | Payer: BC Managed Care – PPO | Source: Ambulatory Visit | Attending: Advanced Practice Midwife | Admitting: Advanced Practice Midwife

## 2020-10-10 ENCOUNTER — Encounter: Payer: Self-pay | Admitting: Advanced Practice Midwife

## 2020-10-10 ENCOUNTER — Other Ambulatory Visit: Payer: Self-pay

## 2020-10-10 ENCOUNTER — Ambulatory Visit (INDEPENDENT_AMBULATORY_CARE_PROVIDER_SITE_OTHER): Payer: BC Managed Care – PPO | Admitting: Advanced Practice Midwife

## 2020-10-10 VITALS — BP 85/56 | Ht 62.0 in | Wt 147.0 lb

## 2020-10-10 DIAGNOSIS — R3915 Urgency of urination: Secondary | ICD-10-CM

## 2020-10-10 DIAGNOSIS — R399 Unspecified symptoms and signs involving the genitourinary system: Secondary | ICD-10-CM

## 2020-10-10 DIAGNOSIS — N898 Other specified noninflammatory disorders of vagina: Secondary | ICD-10-CM

## 2020-10-10 DIAGNOSIS — R350 Frequency of micturition: Secondary | ICD-10-CM

## 2020-10-10 DIAGNOSIS — R300 Dysuria: Secondary | ICD-10-CM | POA: Diagnosis not present

## 2020-10-10 LAB — POCT URINALYSIS DIPSTICK
Bilirubin, UA: NEGATIVE
Blood, UA: NEGATIVE
Glucose, UA: NEGATIVE
Ketones, UA: NEGATIVE
Nitrite, UA: POSITIVE
Protein, UA: POSITIVE — AB
Spec Grav, UA: 1.015 (ref 1.010–1.025)
Urobilinogen, UA: 0.2 E.U./dL
pH, UA: 5 (ref 5.0–8.0)

## 2020-10-12 ENCOUNTER — Other Ambulatory Visit: Payer: Self-pay | Admitting: Advanced Practice Midwife

## 2020-10-12 DIAGNOSIS — B3731 Acute candidiasis of vulva and vagina: Secondary | ICD-10-CM

## 2020-10-12 DIAGNOSIS — B9689 Other specified bacterial agents as the cause of diseases classified elsewhere: Secondary | ICD-10-CM

## 2020-10-12 LAB — CERVICOVAGINAL ANCILLARY ONLY
Bacterial Vaginitis (gardnerella): POSITIVE — AB
Candida Glabrata: NEGATIVE
Candida Vaginitis: NEGATIVE
Comment: NEGATIVE
Comment: NEGATIVE
Comment: NEGATIVE

## 2020-10-12 MED ORDER — FLUCONAZOLE 150 MG PO TABS: 150.0000 mg | ORAL_TABLET | Freq: Once | ORAL | 3 refills | Status: AC

## 2020-10-12 MED ORDER — METRONIDAZOLE 0.75 % VA GEL: 1.0000 | Freq: Every day | VAGINAL | 1 refills | Status: AC

## 2020-10-12 NOTE — Progress Notes (Signed)
Patient ID: Deborah Sanchez, female   DOB: October 23, 1990, 30 y.o.   MRN: [id number]  Reason for Consult: Urinary Tract Infection (Urgency and burning urinating, pelvic and back pain x 1 month and a half) and Vaginal Discharge (Fishy odor, no itchiness or irritation x 1 month and a half)   Referred by Myrene Buddy, *  Subjective:  Date of Service: 10/10/2020  HPI:  Deborah Sanchez is a 30 y.o. female being seen for multiple vaginal and urinary complaints. For the past month and a half she has had incontinence of urine and is wearing a diaper at night since she has been peeing in her sleep. She complains of burning, urgency, frequency, bilateral flank pain and low back pain. Her symptoms have gotten worse since they started. She admits a fever several days ago. She has stabbing pain on left lower quadrant. She admits vaginal discharge with a fishy odor and denies vaginal itching or irritation. Her last PAP smear was normal in 2018 with [facility]. She also has fatigue, headache, aching and nausea. She denies concern for STDs. She has decreased most of her medicationsWe discussed doing labs for UTI and vaginitis and she agrees to wait for results prior to treating.   History reviewed. No pertinent past medical history. Family History  Problem Relation Age of Onset   Diabetes Father    Past Surgical History:  Procedure Laterality Date   CHOLECYSTECTOMY     KNEE SURGERY     KNEE SURGERY Left 2012   MASTOIDECTOMY  1997   RHINOPLASTY     TUBAL LIGATION      Short Social History:  Social History   Tobacco Use   Smoking status: Every Day    Packs/day: 0.50    Years: 7.00    Pack years: 3.50    Types: Cigarettes   Smokeless tobacco: Never  Substance Use Topics   Alcohol use: No    Alcohol/week: 0.0 standard drinks    No Known Allergies  Current Outpatient Medications  Medication Sig Dispense Refill   buprenorphine (SUBUTEX) 2 MG SUBL SL tablet SMARTSIG:2 Tablet(s)  Sublingual Every 12 Hours     FLUoxetine (PROZAC) 20 MG capsule Take 20 mg by mouth daily.     gabapentin (NEURONTIN) 800 MG tablet Take 800 mg by mouth 3 (three) times daily.     methocarbamol (ROBAXIN) 500 MG tablet Take 500 mg by mouth 2 (two) times daily.     traZODone (DESYREL) 100 MG tablet Take 100 mg by mouth at bedtime as needed.     fluconazole (DIFLUCAN) 150 MG tablet Take 1 tablet (150 mg total) by mouth once for 1 dose. Can take additional dose three days later if symptoms persist 1 tablet 3   metroNIDAZOLE (METROGEL) 0.75 % vaginal gel Place 1 Applicatorful vaginally at bedtime for 5 days. 70 g 1   No current facility-administered medications for this visit.   Review of Systems  Constitutional:  Positive for fever and malaise/fatigue. Negative for chills.  HENT:  Negative for congestion, ear discharge, ear pain, hearing loss, sinus pain and sore throat.   Eyes:  Negative for blurred vision and double vision.  Respiratory:  Negative for cough, shortness of breath and wheezing.   Cardiovascular:  Positive for leg swelling. Negative for chest pain and palpitations.  Gastrointestinal:  Positive for abdominal pain and nausea. Negative for blood in stool, constipation, diarrhea, heartburn, melena and vomiting.  Genitourinary:  Positive for dysuria, flank pain, frequency and urgency.  Negative for hematuria.       Positive for vaginal discharge, odor  Musculoskeletal:  Positive for myalgias. Negative for back pain and joint pain.  Skin:  Negative for itching and rash.  Neurological:  Negative for dizziness, tingling, tremors, sensory change, speech change, focal weakness, seizures, loss of consciousness, weakness and headaches.  Endo/Heme/Allergies:  Negative for environmental allergies. Does not bruise/bleed easily.  Psychiatric/Behavioral:  Negative for depression, hallucinations, memory loss, substance abuse and suicidal ideas. The patient is not nervous/anxious and does not have  insomnia.       Objective:  Objective   Vitals:   10/10/20 1604  BP: (!) 85/56  Weight: 147 lb (66.7 kg)  Height: 5\' 2"  (1.575 m)   Body mass index is 26.89 kg/m. Constitutional: Well nourished, well developed female in mild acute distress.  HEENT: normal Skin: Warm and dry.  Cardiovascular: Regular rate and rhythm.   Extremity:  no edema   Respiratory: Clear to auscultation bilateral. Normal respiratory effort Abdomen: soft, nontender, nondistended, no abnormal masses, no epigastric pain Back: no CVAT Neuro: DTRs 2+, Cranial nerves grossly intact Psych: Alert and Oriented x3. No memory deficits. Normal mood and affect.  MS: normal gait, normal bilateral lower extremity ROM/strength/stability.  Pelvic exam:  is not limited by body habitus EGBUS: within normal limits Vagina: within normal limits and with normal mucosa  Cervix: not evaluated   Data: update to visit Results for WANNETTA, LANGLAND (MRN Deborah Sanchez) as of 10/12/2020 13:19  Ref. Range 10/10/2020 16:22 10/10/2020 16:40  Candida Vaginitis Unknown  Negative  Candida Glabrata Unknown  Negative  Bacterial Vaginitis (gardnerella) Unknown  Positive (A)  Appearance Unknown Pend   Bilirubin, UA Unknown neg   Clarity, UA Unknown Pend   Color, UA Unknown Pend   Glucose Latest Ref Range: Negative  Negative   Ketones, UA Unknown neg   Leukocytes,UA Latest Ref Range: Negative  Small (1+) (A)   Nitrite, UA Unknown positive   pH, UA Latest Ref Range: 5.0 - 8.0  5.0   Protein,UA Latest Ref Range: Negative  Positive (A)   Specific Gravity, UA Latest Ref Range: 1.010 - 1.025  1.015   Urobilinogen, UA Latest Ref Range: 0.2 or 1.0 E.U./dL 0.2   RBC, UA Unknown neg     Assessment/Plan:     30 y.o. G2 P2 female with Bacterial Vaginosis and possible UTI (results pending)  Aptima swab Urine Culture Follow up when urine culture results Referral to Urology as needed   26 CNM [facility] 10/12/2020

## 2020-10-12 NOTE — Progress Notes (Signed)
Rx's sent metro gel and diflucan to treat BV and to treat yeast prn. Message to patient.

## 2020-10-15 ENCOUNTER — Other Ambulatory Visit: Payer: Self-pay | Admitting: Advanced Practice Midwife

## 2020-10-15 DIAGNOSIS — N3000 Acute cystitis without hematuria: Secondary | ICD-10-CM

## 2020-10-15 LAB — URINE CULTURE

## 2020-10-15 MED ORDER — CEPHALEXIN 500 MG PO CAPS: 500.0000 mg | ORAL_CAPSULE | Freq: Four times a day (QID) | ORAL | 0 refills | Status: DC

## 2020-10-15 NOTE — Progress Notes (Signed)
Rx Keflex to treat e coli uti. Message to patient.

## 2020-11-23 ENCOUNTER — Ambulatory Visit (INDEPENDENT_AMBULATORY_CARE_PROVIDER_SITE_OTHER): Payer: BC Managed Care – PPO | Admitting: Obstetrics & Gynecology

## 2020-11-23 ENCOUNTER — Encounter: Payer: Self-pay | Admitting: Obstetrics & Gynecology

## 2020-11-23 ENCOUNTER — Telehealth: Payer: Self-pay

## 2020-11-23 ENCOUNTER — Other Ambulatory Visit: Payer: Self-pay

## 2020-11-23 ENCOUNTER — Other Ambulatory Visit (HOSPITAL_COMMUNITY)
Admission: RE | Admit: 2020-11-23 | Discharge: 2020-11-23 | Disposition: A | Discharge status: Home / Self Care | Payer: BC Managed Care – PPO | Source: Ambulatory Visit | Attending: Obstetrics & Gynecology | Admitting: Obstetrics & Gynecology

## 2020-11-23 VITALS — BP 100/60 | Ht 62.0 in | Wt 151.0 lb

## 2020-11-23 DIAGNOSIS — R350 Frequency of micturition: Secondary | ICD-10-CM

## 2020-11-23 DIAGNOSIS — N814 Uterovaginal prolapse, unspecified: Secondary | ICD-10-CM | POA: Diagnosis not present

## 2020-11-23 DIAGNOSIS — Z124 Encounter for screening for malignant neoplasm of cervix: Secondary | ICD-10-CM | POA: Diagnosis present

## 2020-11-23 DIAGNOSIS — R32 Unspecified urinary incontinence: Secondary | ICD-10-CM | POA: Diagnosis not present

## 2020-11-23 DIAGNOSIS — R3915 Urgency of urination: Secondary | ICD-10-CM

## 2020-11-23 DIAGNOSIS — N8111 Cystocele, midline: Secondary | ICD-10-CM

## 2020-11-23 NOTE — Telephone Encounter (Signed)
Left a message for the patient to return the call.  

## 2020-11-23 NOTE — Progress Notes (Signed)
CC: Symptoms of prolapse Patient is a G2P2 WF who complains of a  symptoms realted to prolapse of female tissues . Problem started several months ago. Symptoms include: prolapse of tissue with straining, urinary incontinence: mild, discomfort: mild, and dyspareunia, also inability to climax; she reports urinary freq and urgency, was treated for UTI x2 but sx's unchanged relayed to these treatments. She noticed feeling something vaginally with strain especially.  She has chronic constipation. Symptoms have gradually worsened.  Pt has h/o NSVD x2, no real problems at deliveries although the first had a 4th degree repair.  She has rare periods (oligomenorrhea, uncertain etiology).  She has had tubal, and does not desire future fertility.  PMHx: She  has no past medical history on file. Also,  has a past surgical history that includes Knee surgery; Rhinoplasty; Mastoidectomy (1997); Knee surgery (Left, 2012); Tubal ligation; and Cholecystectomy., family history includes Diabetes in her father.,  reports that she has been smoking cigarettes. She has a 3.50 pack-year smoking history. She has never used smokeless tobacco. She reports that she does not drink alcohol and does not use drugs.  She has a current medication list which includes the following prescription(s): fluoxetine, gabapentin, methocarbamol, and trazodone. Also, has No Known Allergies.  Review of Systems  Constitutional:  Negative for chills, fever and malaise/fatigue.  HENT:  Negative for congestion, sinus pain and sore throat.   Eyes:  Negative for blurred vision and pain.  Respiratory:  Negative for cough and wheezing.   Cardiovascular:  Negative for chest pain and leg swelling.  Gastrointestinal:  Positive for constipation. Negative for abdominal pain, diarrhea, heartburn, nausea and vomiting.  Genitourinary:  Positive for frequency and urgency. Negative for dysuria and hematuria.  Musculoskeletal:  Negative for back pain, joint pain,  myalgias and neck pain.  Skin:  Negative for itching and rash.  Neurological:  Negative for dizziness, tremors and weakness.  Endo/Heme/Allergies:  Does not bruise/bleed easily.  Psychiatric/Behavioral:  Negative for depression. The patient is not nervous/anxious and does not have insomnia.    Objective: BP 100/60   Ht 5\' 2"  (1.575 m)   Wt 151 lb (68.5 kg)   LMP 11/05/2020   BMI 27.62 kg/m  Physical Exam Constitutional:      General: She is not in acute distress.    Appearance: She is well-developed.  Genitourinary:     Bladder normal.     Right Labia: No rash or tenderness.    Left Labia: No tenderness or rash.    No vaginal erythema or bleeding.     No vaginal atrophy present.     Right Adnexa: not tender and no mass present.    Left Adnexa: not tender and no mass present.    No cervical motion tenderness, discharge, polyp or nabothian cyst.     Uterus is enlarged and prolapsed.     No uterine mass detected.    Uterus exam comments: Grade 3 on valsalva.     Uterus is midaxial.     Bladder exam comments: Gr 1 cystocele.     Pelvic exam was performed with patient in the lithotomy position.  HENT:     Head: Normocephalic and atraumatic.     Nose: Nose normal.  Abdominal:     General: There is no distension.     Palpations: Abdomen is soft.     Tenderness: There is no abdominal tenderness.  Musculoskeletal:        General: Normal range of motion.  Neurological:  Mental Status: She is alert and oriented to person, place, and time.     Cranial Nerves: No cranial nerve deficit.  Skin:    General: Skin is warm and dry.  Psychiatric:        Attention and Perception: Attention normal.        Mood and Affect: Mood and affect normal.        Speech: Speech normal.        Behavior: Behavior normal.        Thought Content: Thought content normal.        Judgment: Judgment normal.    ASSESSMENT/PLAN:    Problem List Items Addressed This Visit   None Visit Diagnoses      Uterine prolapse    -  Primary   Urinary urgency       Urinary frequency       Urinary incontinence in female       Screening for cervical cancer       Relevant Orders   Cytology - PAP   Cystocele, midline         Options discussed.  Pessary not a great option at her age and sexuality.  Surgery for hysterectomy and repair counseled. Pros and cons.  Recovery.  Will plan TLH, BS, AR, and cysto.  Info given.  Annamarie Major, MD, [facility], [facility] 11/23/2020  9:10 AM

## 2020-11-23 NOTE — Telephone Encounter (Signed)
Pt rtn call to schedule TLH/BS, Anterior repair, cystoscopy w Tiburcio Pea   DOS 9/1  H&P 8/29 @ 9:40   Pre-admit phone call appointment to be requested - date and time will be included on H&P paper work. Also all appointments will be updated on pt MyChart. Explained that this appointment has a call window. Based on the time scheduled will indicate if the call will be received within a 4 hour window before 1:00 or after.  Advised that pt may also receive calls from the hospital pharmacy and pre-service center.  Confirmed pt has [employer] as Editor, commissioning. No secondary insurance.

## 2020-11-23 NOTE — Telephone Encounter (Signed)
-----   Message from Nadara Mustard, MD sent at 11/23/2020  9:06 AM EDT ----- Regarding: Surgery Surgery Booking Request Patient Full Name:  Deborah Sanchez  MRN: [id number]  DOB: 09-23-1990  Surgeon: [name], MD  Requested Surgery Date and Time: Any, soon per pt request Primary Diagnosis AND Code:    Uterine prolapse  N81.4  Urinary incontinence in female  R32  Cystocele N81.11 Secondary Diagnosis and Code:  Surgical Procedure: TLH/BS, Anterior Repair, Cystoscopy RNFA Requested?: No L&D Notification: No Admission Status: same day surgery Length of Surgery: 75 min Special Case Needs: No H&P: Yes Phone Interview???:  Yes Interpreter: No Medical Clearance:  No Special Scheduling Instructions: No Any known health/anesthesia issues, diabetes, sleep apnea, latex allergy, defibrillator/pacemaker?: No Acuity: P3   (P1 highest, P2 delay may cause harm, P3 low, elective gyn, P4 lowest) Post op follow up visits: yes

## 2020-11-23 NOTE — Patient Instructions (Signed)
Total Laparoscopic Hysterectomy °A total laparoscopic hysterectomy is a minimally invasive surgery to remove the uterus and cervix. The fallopian tubes and ovaries can also be removed during this surgery, if necessary. This procedure may be done to treat problems such as: °Growths in the uterus (uterine fibroids) that are not cancer but cause symptoms. °A condition that causes the lining of the uterus to grow in other areas (endometriosis). °Problems with pelvic support. °Cancer of the cervix, ovaries, uterus, or tissue that lines the uterus (endometrium). °Excessive bleeding in the uterus. °After this procedure, you will no longer be able to have a baby, and you will no longer have a menstrual period. °Tell a health care provider about: °Any allergies you have. °All medicines you are taking, including vitamins, herbs, eye drops, creams, and over-the-counter medicines. °Any problems you or family members have had with anesthetic medicines. °Any blood disorders you have. °Any surgeries you have had. °Any medical conditions you have. °Whether you are pregnant or may be pregnant. °What are the risks? °Generally, this is a safe procedure. However, problems may occur, including: °Infection. °Bleeding. °Blood clots in the legs or lungs. °Allergic reactions to medicines. °Damage to nearby structures or organs. °Having to change from this surgery to one in which a large incision is made in the abdomen (abdominal hysterectomy). °What happens before the procedure? °Staying hydrated °Follow instructions from your health care provider about hydration, which may include: °Up to 2 hours before the procedure - you may continue to drink clear liquids, such as water, clear fruit juice, black coffee, and plain tea. ° °Eating and drinking restrictions °Follow instructions from your health care provider about eating and drinking, which may include: °8 hours before the procedure - stop eating heavy meals or foods, such as meat, fried  foods, or fatty foods. °6 hours before the procedure - stop eating light meals or foods, such as toast or cereal. °6 hours before the procedure - stop drinking milk or drinks that contain milk. °2 hours before the procedure - stop drinking clear liquids. °Medicines °Ask your health care provider about: °Changing or stopping your regular medicines. This is especially important if you are taking diabetes medicines or blood thinners. °Taking medicines such as aspirin and ibuprofen. These medicines can thin your blood. Do not take these medicines unless your health care provider tells you to take them. °Taking over-the-counter medicines, vitamins, herbs, and supplements. °You may be asked to take medicine that helps you have a bowel movement (laxative) to prevent constipation. °General instructions °If you were asked to do bowel preparation before the procedure, follow instructions from your health care provider. °This procedure can affect the way you feel about yourself. Talk with your health care provider about the physical and emotional changes hysterectomy may cause. °Do not use any products that contain nicotine or tobacco for at least 4 weeks before the procedure. These products include cigarettes, chewing tobacco, and vaping devices, such as e-cigarettes. If you need help quitting, ask your health care provider. °Plan to have a responsible adult take you home from the hospital or clinic. °Plan to have a responsible adult care for you for the time you are told after you leave the hospital or clinic. This is important. °Surgery safety °Ask your health care provider: °How your surgery site will be marked. °What steps will be taken to help prevent infection. These may include: °Removing hair at the surgery site. °Washing skin with a germ-killing soap. °Receiving antibiotic medicine. °What happens during the   procedure? °An IV will be inserted into one of your veins. °You will be given one or more of the following: °A  medicine to help you relax (sedative). °A medicine to make you fall asleep (general anesthetic). °A medicine to numb the area (local anesthetic). °A medicine that is injected into your spine to numb the area below and slightly above the injection site (spinal anesthetic). °A medicine that is injected into an area of your body to numb everything below the injection site (regional anesthetic). °A gas will be used to inflate your abdomen. This will allow your surgeon to look inside your abdomen and do the surgery. °Three or four small incisions will be made in your abdomen. °A small device with a light (laparoscope) will be inserted into one of your incisions. Surgical instruments will be inserted through the other incisions in order to perform the procedure. °Your uterus and cervix may be removed through your vagina or cut into small pieces and removed through the small incisions. Any other organs that need to be removed will also be removed this way. °The gas will be released from inside your abdomen. °Your incisions will be closed with stitches (sutures), skin glue, or adhesive strips. °A bandage (dressing) may be placed over your incisions. °The procedure may vary among health care providers and hospitals. °What happens after the procedure? °Your blood pressure, heart rate, breathing rate, and blood oxygen level will be monitored until you leave the hospital or clinic. °You will be given medicine for pain as needed. °You will be encouraged to walk as soon as possible. You will also use a device to help you breathe or do breathing exercises to keep your lungs clear. °You may have to wear compression stockings. These stockings help to prevent blood clots and reduce swelling in your legs. °You will need to wear a sanitary pad for vaginal discharge or bleeding. °Summary °Total laparoscopic hysterectomy is a procedure to remove your uterus, cervix, and sometimes the fallopian tubes and ovaries. °This procedure can  affect the way you feel about yourself. Talk with your health care provider about the physical and emotional changes hysterectomy may cause. °After this procedure, you will no longer be able to have a baby, and you will no longer have a menstrual period. °You will be given pain medicine to control discomfort after this procedure. °Plan to have a responsible adult take you home from the hospital or clinic. °This information is not intended to replace advice given to you by your health care provider. Make sure you discuss any questions you have with your health care provider. °Document Revised: 12/02/2019 Document Reviewed: 12/02/2019 °Elsevier Patient Education © 2022 [employer] ° °

## 2020-11-26 ENCOUNTER — Other Ambulatory Visit: Payer: Self-pay | Admitting: Obstetrics & Gynecology

## 2020-11-26 LAB — CYTOLOGY - PAP
Comment: NEGATIVE
Diagnosis: NEGATIVE
High risk HPV: NEGATIVE

## 2020-12-10 ENCOUNTER — Encounter: Payer: Self-pay | Admitting: Obstetrics & Gynecology

## 2020-12-10 ENCOUNTER — Ambulatory Visit (INDEPENDENT_AMBULATORY_CARE_PROVIDER_SITE_OTHER): Payer: BC Managed Care – PPO | Admitting: Obstetrics & Gynecology

## 2020-12-10 ENCOUNTER — Other Ambulatory Visit: Payer: Self-pay

## 2020-12-10 VITALS — BP 120/70 | Ht 62.0 in | Wt 153.0 lb

## 2020-12-10 DIAGNOSIS — R32 Unspecified urinary incontinence: Secondary | ICD-10-CM

## 2020-12-10 DIAGNOSIS — N8111 Cystocele, midline: Secondary | ICD-10-CM

## 2020-12-10 DIAGNOSIS — N814 Uterovaginal prolapse, unspecified: Secondary | ICD-10-CM

## 2020-12-10 NOTE — Progress Notes (Signed)
PRE-OPERATIVE HISTORY AND PHYSICAL EXAM  HPI:  Deborah Sanchez is a 30 y.o. [id number] No LMP recorded. (Menstrual status: Irregular Periods).; she is being admitted for surgery related to pelvic relaxation.  Problem started several months ago. Symptoms include: prolapse of tissue with straining, urinary incontinence: mild, discomfort: mild, and dyspareunia, also inability to climax; she reports urinary freq and urgency, was treated for UTI x2 but sx's unchanged relayed to these treatments. She noticed feeling something vaginally with strain especially.  She has chronic constipation. Symptoms have gradually worsened.   Pt has h/o NSVD x2, no real problems at deliveries although the first had a 4th degree repair.  She has rare periods (oligomenorrhea, uncertain etiology).  She has had tubal, and does not desire future fertility.  PMHx: No past medical history on file. Past Surgical History:  Procedure Laterality Date   CHOLECYSTECTOMY     KNEE SURGERY     KNEE SURGERY Left 2012   MASTOIDECTOMY  1997   RHINOPLASTY     TUBAL LIGATION     Family History  Problem Relation Age of Onset   Diabetes Father    Social History   Tobacco Use   Smoking status: Every Day    Packs/day: 0.50    Years: 7.00    Pack years: 3.50    Types: Cigarettes   Smokeless tobacco: Never  Vaping Use   Vaping Use: Never used  Substance Use Topics   Alcohol use: No    Alcohol/week: 0.0 standard drinks   Drug use: No    Current Outpatient Medications:    Buprenorphine HCl-Naloxone HCl 8-2 MG FILM, Place 1-1.5 Film under the tongue 3 (three) times daily as needed (opioid dependence)., Disp: , Rfl:    Carboxymethylcellulose Sodium (LUBRICANT EYE DROPS OP), Place 1 drop into both eyes daily as needed (dry eyes)., Disp: , Rfl:    FLUoxetine (PROZAC) 20 MG capsule, Take 20 mg by mouth daily., Disp: , Rfl:    gabapentin (NEURONTIN) 800 MG tablet, Take 800 mg by mouth 3 (three) times daily., Disp: , Rfl:     ibuprofen (ADVIL) 200 MG tablet, Take 800 mg by mouth every 6 (six) hours as needed for headache or moderate pain., Disp: , Rfl:    methocarbamol (ROBAXIN) 500 MG tablet, Take 500 mg by mouth 2 (two) times daily., Disp: , Rfl:    traZODone (DESYREL) 100 MG tablet, Take 100 mg by mouth at bedtime as needed for sleep., Disp: , Rfl:  Allergies: Patient has no known allergies.  Review of Systems  Constitutional:  Negative for chills, fever and malaise/fatigue.  HENT:  Negative for congestion, sinus pain and sore throat.   Eyes:  Negative for blurred vision and pain.  Respiratory:  Negative for cough and wheezing.   Cardiovascular:  Negative for chest pain and leg swelling.  Gastrointestinal:  Negative for abdominal pain, constipation, diarrhea, heartburn, nausea and vomiting.  Genitourinary:  Negative for dysuria, frequency, hematuria and urgency.  Musculoskeletal:  Negative for back pain, joint pain, myalgias and neck pain.  Skin:  Negative for itching and rash.  Neurological:  Negative for dizziness, tremors and weakness.  Endo/Heme/Allergies:  Does not bruise/bleed easily.  Psychiatric/Behavioral:  Negative for depression. The patient is not nervous/anxious and does not have insomnia.    Objective: There were no vitals taken for this visit. There were no vitals filed for this visit. Physical Exam Constitutional:      General: She is not in acute distress.  Appearance: She is well-developed.  HENT:     Head: Normocephalic and atraumatic. No laceration.     Right Ear: Hearing normal.     Left Ear: Hearing normal.     Mouth/Throat:     Pharynx: Uvula midline.  Eyes:     Pupils: Pupils are equal, round, and reactive to light.  Neck:     Thyroid: No thyromegaly.  Cardiovascular:     Rate and Rhythm: Normal rate and regular rhythm.     Heart sounds: No murmur heard.   No friction rub. No gallop.  Pulmonary:     Effort: Pulmonary effort is normal. No respiratory distress.      Breath sounds: Normal breath sounds. No wheezing.  Abdominal:     General: Bowel sounds are normal. There is no distension.     Palpations: Abdomen is soft.     Tenderness: There is no abdominal tenderness. There is no rebound.  Musculoskeletal:        General: Normal range of motion.     Cervical back: Normal range of motion and neck supple.  Neurological:     Mental Status: She is alert and oriented to person, place, and time.     Cranial Nerves: No cranial nerve deficit.  Skin:    General: Skin is warm and dry.  Psychiatric:        Judgment: Judgment normal.  Vitals reviewed.   Assessment: 1. Uterine prolapse   2. Cystocele, midline   3. Urinary incontinence in female   Plan hysterectomy with anterior repair Discussed preservation of ovaries due to her age Discussed recovery and lifting restrictions and pelvic rest  I have had a careful discussion with this patient about all the options available and the risk/benefits of each. I have fully informed this patient that surgery may subject her to a variety of discomforts and risks: She understands that most patients have surgery with little difficulty, but problems can happen ranging from minor to fatal. These include nausea, vomiting, pain, bleeding, infection, poor healing, hernia, or formation of adhesions. Unexpected reactions may occur from any drug or anesthetic given. Unintended injury may occur to other pelvic or abdominal structures such as Fallopian tubes, ovaries, bladder, ureter (tube from kidney to bladder), or bowel. Nerves going from the pelvis to the legs may be injured. Any such injury may require immediate or later additional surgery to correct the problem. Excessive blood loss requiring transfusion is very unlikely but possible. Dangerous blood clots may form in the legs or lungs. Physical and sexual activity will be restricted in varying degrees for an indeterminate period of time but most often 2-6 weeks.  Finally, she  understands that it is impossible to list every possible undesirable effect and that the condition for which surgery is done is not always cured or significantly improved, and in rare cases may be even worse.Ample time was given to answer all questions.  Annamarie Major, MD, [facility], [facility] 12/10/2020  9:11 AM

## 2020-12-10 NOTE — H&P (View-Only) (Signed)
   PRE-OPERATIVE HISTORY AND PHYSICAL EXAM  HPI:  Deborah Sanchez is a 29 y.o. [id number] No LMP recorded. (Menstrual status: Irregular Periods).; she is being admitted for surgery related to pelvic relaxation.  Problem started several months ago. Symptoms include: prolapse of tissue with straining, urinary incontinence: mild, discomfort: mild, and dyspareunia, also inability to climax; she reports urinary freq and urgency, was treated for UTI x2 but sx's unchanged relayed to these treatments. She noticed feeling something vaginally with strain especially.  She has chronic constipation. Symptoms have gradually worsened.   Pt has h/o NSVD x2, no real problems at deliveries although the first had a 4th degree repair.  She has rare periods (oligomenorrhea, uncertain etiology).  She has had tubal, and does not desire future fertility.  PMHx: No past medical history on file. Past Surgical History:  Procedure Laterality Date   CHOLECYSTECTOMY     KNEE SURGERY     KNEE SURGERY Left 2012   MASTOIDECTOMY  1997   RHINOPLASTY     TUBAL LIGATION     Family History  Problem Relation Age of Onset   Diabetes Father    Social History   Tobacco Use   Smoking status: Every Day    Packs/day: 0.50    Years: 7.00    Pack years: 3.50    Types: Cigarettes   Smokeless tobacco: Never  Vaping Use   Vaping Use: Never used  Substance Use Topics   Alcohol use: No    Alcohol/week: 0.0 standard drinks   Drug use: No    Current Outpatient Medications:    Buprenorphine HCl-Naloxone HCl 8-2 MG FILM, Place 1-1.5 Film under the tongue 3 (three) times daily as needed (opioid dependence)., Disp: , Rfl:    Carboxymethylcellulose Sodium (LUBRICANT EYE DROPS OP), Place 1 drop into both eyes daily as needed (dry eyes)., Disp: , Rfl:    FLUoxetine (PROZAC) 20 MG capsule, Take 20 mg by mouth daily., Disp: , Rfl:    gabapentin (NEURONTIN) 800 MG tablet, Take 800 mg by mouth 3 (three) times daily., Disp: , Rfl:     ibuprofen (ADVIL) 200 MG tablet, Take 800 mg by mouth every 6 (six) hours as needed for headache or moderate pain., Disp: , Rfl:    methocarbamol (ROBAXIN) 500 MG tablet, Take 500 mg by mouth 2 (two) times daily., Disp: , Rfl:    traZODone (DESYREL) 100 MG tablet, Take 100 mg by mouth at bedtime as needed for sleep., Disp: , Rfl:  Allergies: Patient has no known allergies.  Review of Systems  Constitutional:  Negative for chills, fever and malaise/fatigue.  HENT:  Negative for congestion, sinus pain and sore throat.   Eyes:  Negative for blurred vision and pain.  Respiratory:  Negative for cough and wheezing.   Cardiovascular:  Negative for chest pain and leg swelling.  Gastrointestinal:  Negative for abdominal pain, constipation, diarrhea, heartburn, nausea and vomiting.  Genitourinary:  Negative for dysuria, frequency, hematuria and urgency.  Musculoskeletal:  Negative for back pain, joint pain, myalgias and neck pain.  Skin:  Negative for itching and rash.  Neurological:  Negative for dizziness, tremors and weakness.  Endo/Heme/Allergies:  Does not bruise/bleed easily.  Psychiatric/Behavioral:  Negative for depression. The patient is not nervous/anxious and does not have insomnia.    Objective: There were no vitals taken for this visit. There were no vitals filed for this visit. Physical Exam Constitutional:      General: She is not in acute distress.      Appearance: She is well-developed.  HENT:     Head: Normocephalic and atraumatic. No laceration.     Right Ear: Hearing normal.     Left Ear: Hearing normal.     Mouth/Throat:     Pharynx: Uvula midline.  Eyes:     Pupils: Pupils are equal, round, and reactive to light.  Neck:     Thyroid: No thyromegaly.  Cardiovascular:     Rate and Rhythm: Normal rate and regular rhythm.     Heart sounds: No murmur heard.   No friction rub. No gallop.  Pulmonary:     Effort: Pulmonary effort is normal. No respiratory distress.      Breath sounds: Normal breath sounds. No wheezing.  Abdominal:     General: Bowel sounds are normal. There is no distension.     Palpations: Abdomen is soft.     Tenderness: There is no abdominal tenderness. There is no rebound.  Musculoskeletal:        General: Normal range of motion.     Cervical back: Normal range of motion and neck supple.  Neurological:     Mental Status: She is alert and oriented to person, place, and time.     Cranial Nerves: No cranial nerve deficit.  Skin:    General: Skin is warm and dry.  Psychiatric:        Judgment: Judgment normal.  Vitals reviewed.   Assessment: 1. Uterine prolapse   2. Cystocele, midline   3. Urinary incontinence in female   Plan hysterectomy with anterior repair Discussed preservation of ovaries due to her age Discussed recovery and lifting restrictions and pelvic rest  I have had a careful discussion with this patient about all the options available and the risk/benefits of each. I have fully informed this patient that surgery may subject her to a variety of discomforts and risks: She understands that most patients have surgery with little difficulty, but problems can happen ranging from minor to fatal. These include nausea, vomiting, pain, bleeding, infection, poor healing, hernia, or formation of adhesions. Unexpected reactions may occur from any drug or anesthetic given. Unintended injury may occur to other pelvic or abdominal structures such as Fallopian tubes, ovaries, bladder, ureter (tube from kidney to bladder), or bowel. Nerves going from the pelvis to the legs may be injured. Any such injury may require immediate or later additional surgery to correct the problem. Excessive blood loss requiring transfusion is very unlikely but possible. Dangerous blood clots may form in the legs or lungs. Physical and sexual activity will be restricted in varying degrees for an indeterminate period of time but most often 2-6 weeks.  Finally, she  understands that it is impossible to list every possible undesirable effect and that the condition for which surgery is done is not always cured or significantly improved, and in rare cases may be even worse.Ample time was given to answer all questions.  Annamarie Major, MD, [facility], [facility] 12/10/2020  9:11 AM

## 2020-12-10 NOTE — Patient Instructions (Signed)
Total Laparoscopic Hysterectomy, Care After The following information offers guidance on how to care for yourself after your procedure. Your health care provider may also give you more specific instructions. If you have problems or questions, contact your health careprovider. What can I expect after the procedure? After the procedure, it is common to have: Pain, bruising, and numbness around your incisions. Tiredness (fatigue). Poor appetite. Less interest in sex. Vaginal discharge or bleeding. You will need to use a sanitary pad after this procedure. Feelings of sadness or other emotions. If your ovaries were also removed, it is also common to have symptoms of menopause, such as hot flashes, night sweats, and lack of sleep (insomnia). Follow these instructions at home: Medicines Take over-the-counter and prescription medicines only as told by your health care provider. Ask your health care provider if the medicine prescribed to you: Requires you to avoid driving or using machinery. Can cause constipation. You may need to take these actions to prevent or treat constipation: Drink enough fluid to keep your urine pale yellow. Take over-the-counter or prescription medicines. Eat foods that are high in fiber, such as beans, whole grains, and fresh fruits and vegetables. Limit foods that are high in fat and processed sugars, such as fried or sweet foods. Incision care  Follow instructions from your health care provider about how to take care of your incisions. Make sure you: Wash your hands with soap and water for at least 20 seconds before and after you change your bandage (dressing). If soap and water are not available, use hand sanitizer. Change your dressing as told by your health care provider. Leave stitches (sutures), skin glue, or adhesive strips in place. These skin closures may need to stay in place for 2 weeks or longer. If adhesive strip edges start to loosen and curl up, you may  trim the loose edges. Do not remove adhesive strips completely unless your health care provider tells you to do that. Check your incision areas every day for signs of infection. Check for: More redness, swelling, or pain. Fluid or blood. Warmth. Pus or a bad smell.  Activity  Rest as told by your health care provider. Avoid sitting for a long time without moving. Get up to take short walks every 1-2 hours. This is important to improve blood flow and breathing. Ask for help if you feel weak or unsteady. Return to your normal activities as told by your health care provider. Ask your health care provider what activities are safe for you. Do not lift anything that is heavier than 10 lb (4.5 kg), or the limit that you are told, for one month after surgery or until your health care provider says that it is safe. If you were given a sedative during the procedure, it can affect you for several hours. Do not drive or operate machinery until your health care provider says that it is safe.  Lifestyle Do not use any products that contain nicotine or tobacco. These products include cigarettes, chewing tobacco, and vaping devices, such as e-cigarettes. These can delay healing after surgery. If you need help quitting, ask your health care provider. Do not drink alcohol until your health care provider approves. General instructions  Do not douche, use tampons, or have sex for at least 6 weeks, or as told by your health care provider. If you struggle with physical or emotional changes after your procedure, speak with your health care provider or a therapist. Do not take baths, swim, or use a hot   tub until your health care provider approves. You may only be allowed to take showers for 2-3 weeks. Keep your dressing dry until your health care provider says it can be removed. Try to have someone at home with you for the first 1-2 weeks to help with your daily chores. Wear compression stockings as told by your  health care provider. These stockings help to prevent blood clots and reduce swelling in your legs. Keep all follow-up visits. This is important.  Contact a health care provider if: You have any of these signs of infection: Chills or a fever. More redness, swelling, or pain around an incision. Fluid or blood coming from an incision. Warmth coming from an incision. Pus or a bad smell coming from an incision. An incision opens. You feel dizzy or light-headed. You have pain or bleeding when you urinate, or you are unable to urinate. You have abnormal vaginal discharge. You have pain that does not get better with medicine. Get help right away if: You have a fever and your symptoms suddenly get worse. You have severe abdominal pain. You have chest pain or shortness of breath. You faint. You have pain, swelling, or redness in your leg. You have heavy vaginal bleeding with blood clots, soaking through a sanitary pad in less than 1 hour. These symptoms may represent a serious problem that is an emergency. Do not wait to see if the symptoms will go away. Get medical help right away. Call your local emergency services (911 in the [place] Do not drive yourself to the hospital. Summary After the procedure, it is common to have pain and bruising around your incisions. Do not take baths, swim, or use a hot tub until your health care provider approves. Do not lift anything that is heavier than 10 lb (4.5 kg), or the limit that you are told, for one month after surgery or until your health care provider says that it is safe. Tell your health care provider if you have any signs or symptoms of infection after the procedure. Get help right away if you have severe abdominal pain, chest pain, shortness of breath, or heavy bleeding from your vagina. This information is not intended to replace advice given to you by your health care provider. Make sure you discuss any questions you have with your healthcare  provider. Document Revised: 12/02/2019 Document Reviewed: 12/02/2019 Elsevier Patient Education  2022 Elsevier Inc.  

## 2020-12-11 ENCOUNTER — Encounter
Admission: RE | Admit: 2020-12-11 | Discharge: 2020-12-11 | Disposition: A | Discharge status: Home / Self Care | Payer: BC Managed Care – PPO | Source: Ambulatory Visit | Attending: Obstetrics & Gynecology | Admitting: Obstetrics & Gynecology

## 2020-12-11 ENCOUNTER — Encounter: Payer: Self-pay | Admitting: Urgent Care

## 2020-12-11 ENCOUNTER — Other Ambulatory Visit
Admission: RE | Admit: 2020-12-11 | Discharge: 2020-12-11 | Disposition: A | Discharge status: Home / Self Care | Payer: BC Managed Care – PPO | Source: Home / Self Care | Attending: Obstetrics & Gynecology | Admitting: Obstetrics & Gynecology

## 2020-12-11 ENCOUNTER — Encounter
Admission: RE | Admit: 2020-12-11 | Discharge: 2020-12-11 | Disposition: A | Discharge status: Home / Self Care | Payer: BC Managed Care – PPO | Source: Ambulatory Visit

## 2020-12-11 DIAGNOSIS — Z01812 Encounter for preprocedural laboratory examination: Secondary | ICD-10-CM | POA: Insufficient documentation

## 2020-12-11 DIAGNOSIS — K5909 Other constipation: Secondary | ICD-10-CM | POA: Diagnosis not present

## 2020-12-11 DIAGNOSIS — R32 Unspecified urinary incontinence: Secondary | ICD-10-CM | POA: Diagnosis not present

## 2020-12-11 DIAGNOSIS — N814 Uterovaginal prolapse, unspecified: Secondary | ICD-10-CM | POA: Diagnosis not present

## 2020-12-11 DIAGNOSIS — F17210 Nicotine dependence, cigarettes, uncomplicated: Secondary | ICD-10-CM | POA: Diagnosis not present

## 2020-12-11 DIAGNOSIS — Z79899 Other long term (current) drug therapy: Secondary | ICD-10-CM | POA: Diagnosis not present

## 2020-12-11 HISTORY — DX: Depression, unspecified: F32.A

## 2020-12-11 HISTORY — DX: Family history of other specified conditions: Z84.89

## 2020-12-11 HISTORY — DX: Anxiety disorder, unspecified: F41.9

## 2020-12-11 LAB — CBC
HCT: 37.5 % (ref 36.0–46.0)
Hemoglobin: 12.5 g/dL (ref 12.0–15.0)
MCH: 29.8 pg (ref 26.0–34.0)
MCHC: 33.3 g/dL (ref 30.0–36.0)
MCV: 89.3 fL (ref 80.0–100.0)
Platelets: 214 10*3/uL (ref 150–400)
RBC: 4.2 MIL/uL (ref 3.87–5.11)
RDW: 11.9 % (ref 11.5–15.5)
WBC: 5.7 10*3/uL (ref 4.0–10.5)
nRBC: 0 % (ref 0.0–0.2)

## 2020-12-11 LAB — TYPE AND SCREEN
ABO/RH(D): O POS
Antibody Screen: NEGATIVE

## 2020-12-11 NOTE — Patient Instructions (Addendum)
Your procedure is scheduled on: Thursday, September 1 Report to the Registration Desk on the 1st floor of the [facility] To find out your arrival time, please call [phone] between 1PM - 3PM on: Wednesday, August 31  REMEMBER: Instructions that are not followed completely may result in serious medical risk, up to and including death; or upon the discretion of your surgeon and anesthesiologist your surgery may need to be rescheduled.  Do not eat food after midnight the night before surgery.  No gum chewing, lozengers or hard candies.  You may however, drink CLEAR liquids up to 2 hours before you are scheduled to arrive for your surgery. Do not drink anything within 2 hours of your scheduled arrival time.  Clear liquids include: - water  - apple juice without pulp - gatorade (not RED, PURPLE, OR BLUE) - black coffee or tea (Do NOT add milk or creamers to the coffee or tea) Do NOT drink anything that is not on this list.  TAKE THESE MEDICATIONS THE MORNING OF SURGERY WITH A SIP OF WATER:  Fluoxetine (Prozac) Gabapentin  One week prior to surgery: Stop Anti-inflammatories (NSAIDS) such as Advil, Aleve, Ibuprofen, Motrin, Naproxen, Naprosyn and Aspirin based products such as Excedrin, Goodys Powder, BC Powder. Stop ANY OVER THE COUNTER supplements until after surgery. You may however, continue to take Tylenol if needed for pain up until the day of surgery.  No Alcohol for 24 hours before or after surgery.  No Smoking including e-cigarettes for 24 hours prior to surgery.  No chewable tobacco products for at least 6 hours prior to surgery.  No nicotine patches on the day of surgery.  Do not use any "recreational" drugs for at least a week prior to your surgery.  Please be advised that the combination of cocaine and anesthesia may have negative outcomes, up to and including death. If you test positive for cocaine, your surgery will be cancelled.  On the morning of surgery  brush your teeth with toothpaste and water, you may rinse your mouth with mouthwash if you wish. Do not swallow any toothpaste or mouthwash.  Do not wear jewelry, make-up, hairpins, clips or nail polish.  Do not wear lotions, powders, or perfumes.   Do not shave body from the neck down 48 hours prior to surgery just in case you cut yourself which could leave a site for infection.  Also, freshly shaved skin may become irritated if using the CHG soap.  Contact lenses, hearing aids and dentures may not be worn into surgery.  Do not bring valuables to the hospital. [facility] is not responsible for any missing/lost belongings or valuables.   Use CHG Soap as directed on instruction sheet.  Notify your doctor if there is any change in your medical condition (cold, fever, infection).  Wear comfortable clothing (specific to your surgery type) to the hospital.  After surgery, you can help prevent lung complications by doing breathing exercises.  Take deep breaths and cough every 1-2 hours. Your doctor may order a device called an Incentive Spirometer to help you take deep breaths. When coughing or sneezing, hold a pillow firmly against your incision with both hands. This is called "splinting." Doing this helps protect your incision. It also decreases belly discomfort.  If you are being discharged the day of surgery, you will not be allowed to drive home. You will need a responsible adult (18 years or older) to drive you home and stay with you that night.   If  you are taking public transportation, you will need to have a responsible adult (18 years or older) with you. Please confirm with your physician that it is acceptable to use public transportation.   Please call the Pre-admissions Testing Dept. at [phone] if you have any questions about these instructions.  Surgery Visitation Policy:  Patients undergoing a surgery or procedure may have one family member or support person with  them as long as that person is not COVID-19 positive or experiencing its symptoms.  That person may remain in the waiting area during the procedure.

## 2020-12-12 MED ORDER — ORAL CARE MOUTH RINSE: 15.0000 mL | Freq: Once | OROMUCOSAL | Status: AC

## 2020-12-12 MED ORDER — LACTATED RINGERS IV SOLN: INTRAVENOUS | Status: DC

## 2020-12-12 MED ORDER — SODIUM CHLORIDE 0.9 % IV SOLN
2.0000 g | INTRAVENOUS | Status: AC
  Administered 2020-12-13: 2 g via INTRAVENOUS

## 2020-12-12 MED ORDER — CHLORHEXIDINE GLUCONATE 0.12 % MT SOLN
15.0000 mL | Freq: Once | OROMUCOSAL | Status: AC
  Administered 2020-12-13: 15 mL via OROMUCOSAL

## 2020-12-12 MED ORDER — FAMOTIDINE 20 MG PO TABS
20.0000 mg | ORAL_TABLET | Freq: Once | ORAL | Status: AC
  Administered 2020-12-13: 20 mg via ORAL

## 2020-12-12 MED ORDER — POVIDONE-IODINE 10 % EX SWAB
2.0000 "application " | Freq: Once | CUTANEOUS | Status: AC
  Administered 2020-12-13: 2 via TOPICAL

## 2020-12-13 ENCOUNTER — Encounter: Payer: Self-pay | Admitting: Obstetrics & Gynecology

## 2020-12-13 ENCOUNTER — Ambulatory Visit
Admission: RE | Admit: 2020-12-13 | Discharge: 2020-12-13 | Disposition: A | Discharge status: Home / Self Care | Payer: BC Managed Care – PPO | Attending: Obstetrics & Gynecology | Admitting: Obstetrics & Gynecology

## 2020-12-13 ENCOUNTER — Encounter
Admission: RE | Disposition: A | Discharge status: Home / Self Care | Payer: Self-pay | Source: Home / Self Care | Attending: Obstetrics & Gynecology

## 2020-12-13 ENCOUNTER — Other Ambulatory Visit: Payer: Self-pay

## 2020-12-13 ENCOUNTER — Ambulatory Visit: Payer: BC Managed Care – PPO | Admitting: Anesthesiology

## 2020-12-13 DIAGNOSIS — N814 Uterovaginal prolapse, unspecified: Secondary | ICD-10-CM

## 2020-12-13 DIAGNOSIS — R32 Unspecified urinary incontinence: Secondary | ICD-10-CM | POA: Insufficient documentation

## 2020-12-13 DIAGNOSIS — D069 Carcinoma in situ of cervix, unspecified: Secondary | ICD-10-CM | POA: Diagnosis not present

## 2020-12-13 DIAGNOSIS — F17210 Nicotine dependence, cigarettes, uncomplicated: Secondary | ICD-10-CM | POA: Insufficient documentation

## 2020-12-13 DIAGNOSIS — K5909 Other constipation: Secondary | ICD-10-CM | POA: Insufficient documentation

## 2020-12-13 DIAGNOSIS — Z79899 Other long term (current) drug therapy: Secondary | ICD-10-CM | POA: Insufficient documentation

## 2020-12-13 HISTORY — PX: CYSTOSCOPY: SHX5120

## 2020-12-13 HISTORY — PX: TOTAL LAPAROSCOPIC HYSTERECTOMY WITH SALPINGECTOMY: SHX6742

## 2020-12-13 LAB — URINE DRUG SCREEN, QUALITATIVE (ARMC ONLY)
Amphetamines, Ur Screen: NOT DETECTED
Barbiturates, Ur Screen: NOT DETECTED
Benzodiazepine, Ur Scrn: NOT DETECTED
Cannabinoid 50 Ng, Ur ~~LOC~~: NOT DETECTED
Cocaine Metabolite,Ur ~~LOC~~: NOT DETECTED
MDMA (Ecstasy)Ur Screen: NOT DETECTED
Methadone Scn, Ur: NOT DETECTED
Opiate, Ur Screen: NOT DETECTED
Phencyclidine (PCP) Ur S: NOT DETECTED
Tricyclic, Ur Screen: NOT DETECTED

## 2020-12-13 LAB — ABO/RH: ABO/RH(D): O POS

## 2020-12-13 LAB — POCT PREGNANCY, URINE: Preg Test, Ur: NEGATIVE

## 2020-12-13 SURGERY — HYSTERECTOMY, TOTAL, LAPAROSCOPIC, WITH SALPINGECTOMY
Anesthesia: General

## 2020-12-13 MED ORDER — MIDAZOLAM HCL 2 MG/2ML IJ SOLN
INTRAMUSCULAR | Status: DC | PRN
  Administered 2020-12-13: 2 mg via INTRAVENOUS

## 2020-12-13 MED ORDER — ACETAMINOPHEN 650 MG RE SUPP
650.0000 mg | RECTAL | Status: DC | PRN
  Filled 2020-12-13: qty 1

## 2020-12-13 MED ORDER — ROCURONIUM BROMIDE 10 MG/ML (PF) SYRINGE
PREFILLED_SYRINGE | INTRAVENOUS | Status: AC
  Filled 2020-12-13: qty 10

## 2020-12-13 MED ORDER — FAMOTIDINE 20 MG PO TABS
ORAL_TABLET | ORAL | Status: AC
  Filled 2020-12-13: qty 1

## 2020-12-13 MED ORDER — GLYCOPYRROLATE 0.2 MG/ML IJ SOLN
INTRAMUSCULAR | Status: DC | PRN
  Administered 2020-12-13: .2 mg via INTRAVENOUS

## 2020-12-13 MED ORDER — SODIUM CHLORIDE 0.9 % IR SOLN
Status: DC | PRN
Start: 2020-12-13 — End: 2020-12-13
  Administered 2020-12-13: 100 mL

## 2020-12-13 MED ORDER — ONDANSETRON HCL 4 MG/2ML IJ SOLN
INTRAMUSCULAR | Status: DC | PRN
  Administered 2020-12-13: 4 mg via INTRAVENOUS

## 2020-12-13 MED ORDER — PROPOFOL 10 MG/ML IV BOLUS
INTRAVENOUS | Status: DC | PRN
  Administered 2020-12-13: 120 mg via INTRAVENOUS

## 2020-12-13 MED ORDER — CHLORHEXIDINE GLUCONATE 0.12 % MT SOLN
OROMUCOSAL | Status: AC
  Filled 2020-12-13: qty 15

## 2020-12-13 MED ORDER — ACETAMINOPHEN 10 MG/ML IV SOLN
INTRAVENOUS | Status: DC | PRN
  Administered 2020-12-13: 1000 mg via INTRAVENOUS

## 2020-12-13 MED ORDER — FENTANYL CITRATE (PF) 100 MCG/2ML IJ SOLN
INTRAMUSCULAR | Status: AC
  Administered 2020-12-13: 50 ug via INTRAVENOUS
  Filled 2020-12-13: qty 2

## 2020-12-13 MED ORDER — LIDOCAINE-EPINEPHRINE 1 %-1:100000 IJ SOLN
INTRAMUSCULAR | Status: AC
  Filled 2020-12-13: qty 1

## 2020-12-13 MED ORDER — OXYCODONE HCL 5 MG PO TABS
5.0000 mg | ORAL_TABLET | Freq: Once | ORAL | Status: AC | PRN
  Administered 2020-12-13: 5 mg via ORAL

## 2020-12-13 MED ORDER — ACETAMINOPHEN 325 MG PO TABS: 650.0000 mg | ORAL_TABLET | ORAL | Status: DC | PRN

## 2020-12-13 MED ORDER — ROCURONIUM BROMIDE 100 MG/10ML IV SOLN
INTRAVENOUS | Status: DC | PRN
  Administered 2020-12-13: 10 mg via INTRAVENOUS
  Administered 2020-12-13: 50 mg via INTRAVENOUS

## 2020-12-13 MED ORDER — BUPIVACAINE HCL (PF) 0.5 % IJ SOLN
INTRAMUSCULAR | Status: DC | PRN
  Administered 2020-12-13: 10 mL

## 2020-12-13 MED ORDER — FENTANYL CITRATE (PF) 250 MCG/5ML IJ SOLN
INTRAMUSCULAR | Status: AC
  Filled 2020-12-13: qty 5

## 2020-12-13 MED ORDER — ONDANSETRON HCL 4 MG/2ML IJ SOLN
INTRAMUSCULAR | Status: AC
  Filled 2020-12-13: qty 2

## 2020-12-13 MED ORDER — LORAZEPAM 2 MG/ML IJ SOLN: 0.5000 mg | Freq: Once | INTRAMUSCULAR | Status: AC

## 2020-12-13 MED ORDER — DEXAMETHASONE SODIUM PHOSPHATE 10 MG/ML IJ SOLN
INTRAMUSCULAR | Status: DC | PRN
  Administered 2020-12-13: 10 mg via INTRAVENOUS

## 2020-12-13 MED ORDER — FENTANYL CITRATE (PF) 100 MCG/2ML IJ SOLN
INTRAMUSCULAR | Status: DC | PRN
  Administered 2020-12-13 (×5): 50 ug via INTRAVENOUS

## 2020-12-13 MED ORDER — OXYCODONE HCL 5 MG PO TABS
ORAL_TABLET | ORAL | Status: AC
  Filled 2020-12-13: qty 1

## 2020-12-13 MED ORDER — 0.9 % SODIUM CHLORIDE (POUR BTL) OPTIME
TOPICAL | Status: DC | PRN
  Administered 2020-12-13: 200 mL

## 2020-12-13 MED ORDER — LACTATED RINGERS IR SOLN
Status: DC | PRN
  Administered 2020-12-13: 100 mL

## 2020-12-13 MED ORDER — LORAZEPAM 2 MG/ML IJ SOLN
INTRAMUSCULAR | Status: AC
  Administered 2020-12-13: 0.5 mg via INTRAVENOUS
  Filled 2020-12-13: qty 1

## 2020-12-13 MED ORDER — FENTANYL CITRATE (PF) 100 MCG/2ML IJ SOLN
25.0000 ug | INTRAMUSCULAR | Status: DC | PRN
  Administered 2020-12-13 (×2): 50 ug via INTRAVENOUS

## 2020-12-13 MED ORDER — GLYCOPYRROLATE 0.2 MG/ML IJ SOLN
INTRAMUSCULAR | Status: AC
  Filled 2020-12-13: qty 1

## 2020-12-13 MED ORDER — DEXAMETHASONE SODIUM PHOSPHATE 10 MG/ML IJ SOLN
INTRAMUSCULAR | Status: AC
  Filled 2020-12-13: qty 1

## 2020-12-13 MED ORDER — LIDOCAINE HCL (PF) 2 % IJ SOLN
INTRAMUSCULAR | Status: AC
  Filled 2020-12-13: qty 5

## 2020-12-13 MED ORDER — SUGAMMADEX SODIUM 200 MG/2ML IV SOLN
INTRAVENOUS | Status: DC | PRN
  Administered 2020-12-13: 200 mg via INTRAVENOUS

## 2020-12-13 MED ORDER — BUPIVACAINE HCL (PF) 0.5 % IJ SOLN
INTRAMUSCULAR | Status: AC
  Filled 2020-12-13: qty 30

## 2020-12-13 MED ORDER — PROPOFOL 500 MG/50ML IV EMUL
INTRAVENOUS | Status: AC
  Filled 2020-12-13: qty 100

## 2020-12-13 MED ORDER — MIDAZOLAM HCL 2 MG/2ML IJ SOLN
INTRAMUSCULAR | Status: AC
  Filled 2020-12-13: qty 2

## 2020-12-13 MED ORDER — DEXMEDETOMIDINE (PRECEDEX) IN NS 20 MCG/5ML (4 MCG/ML) IV SYRINGE
PREFILLED_SYRINGE | INTRAVENOUS | Status: DC | PRN
  Administered 2020-12-13 (×3): 4 ug via INTRAVENOUS

## 2020-12-13 MED ORDER — OXYCODONE-ACETAMINOPHEN 5-325 MG PO TABS: 1.0000 | ORAL_TABLET | ORAL | Status: DC | PRN

## 2020-12-13 MED ORDER — LIDOCAINE HCL (CARDIAC) PF 100 MG/5ML IV SOSY
PREFILLED_SYRINGE | INTRAVENOUS | Status: DC | PRN
  Administered 2020-12-13: 50 mg via INTRAVENOUS

## 2020-12-13 MED ORDER — OXYCODONE-ACETAMINOPHEN 5-325 MG PO TABS: 1.0000 | ORAL_TABLET | ORAL | 0 refills | Status: DC | PRN

## 2020-12-13 MED ORDER — ACETAMINOPHEN 10 MG/ML IV SOLN
INTRAVENOUS | Status: AC
  Filled 2020-12-13: qty 100

## 2020-12-13 MED ORDER — PROPOFOL 500 MG/50ML IV EMUL
INTRAVENOUS | Status: DC | PRN
  Administered 2020-12-13: 150 ug/kg/min via INTRAVENOUS

## 2020-12-13 MED ORDER — LACTATED RINGERS IV SOLN: INTRAVENOUS | Status: DC

## 2020-12-13 MED ORDER — MORPHINE SULFATE (PF) 2 MG/ML IV SOLN
1.0000 mg | INTRAVENOUS | Status: DC | PRN
Start: 2020-12-13 — End: 2020-12-13

## 2020-12-13 MED ORDER — SODIUM CHLORIDE 0.9 % IV SOLN
INTRAVENOUS | Status: AC
  Filled 2020-12-13: qty 2

## 2020-12-13 MED ORDER — OXYCODONE HCL 5 MG/5ML PO SOLN
5.0000 mg | Freq: Once | ORAL | Status: AC | PRN
Start: 2020-12-13 — End: 2020-12-13

## 2020-12-13 SURGICAL SUPPLY — 62 items
ADH SKN CLS APL DERMABOND .7 (GAUZE/BANDAGES/DRESSINGS) ×2
APL PRP STRL LF DISP 70% ISPRP (MISCELLANEOUS) ×2
BAG DRN RND TRDRP ANRFLXCHMBR (UROLOGICAL SUPPLIES) ×2
BAG URINE DRAIN 2000ML AR STRL (UROLOGICAL SUPPLIES) ×3 IMPLANT
BLADE SURG SZ11 CARB STEEL (BLADE) ×3 IMPLANT
CATH FOLEY 2WAY  5CC 16FR (CATHETERS) ×1
CATH FOLEY 2WAY 5CC 16FR (CATHETERS) ×2
CATH URTH 16FR FL 2W BLN LF (CATHETERS) ×2 IMPLANT
CHLORAPREP W/TINT 26 (MISCELLANEOUS) ×3 IMPLANT
DERMABOND ADVANCED (GAUZE/BANDAGES/DRESSINGS) ×1
DERMABOND ADVANCED .7 DNX12 (GAUZE/BANDAGES/DRESSINGS) ×2 IMPLANT
DEVICE SUTURE ENDOST 10MM (ENDOMECHANICALS) ×1 IMPLANT
DRAPE CAMERA CLOSED 9X96 (DRAPES) ×3 IMPLANT
DRSG TEGADERM 2-3/8X2-3/4 SM (GAUZE/BANDAGES/DRESSINGS) ×3 IMPLANT
GAUZE 4X4 16PLY ~~LOC~~+RFID DBL (SPONGE) ×7 IMPLANT
GLOVE SURG ENC MOIS LTX SZ8 (GLOVE) ×9 IMPLANT
GLOVE SURG UNDER LTX SZ8 (GLOVE) ×9 IMPLANT
GOWN STRL REUS W/ TWL LRG LVL3 (GOWN DISPOSABLE) ×8 IMPLANT
GOWN STRL REUS W/ TWL XL LVL3 (GOWN DISPOSABLE) ×6 IMPLANT
GOWN STRL REUS W/TWL LRG LVL3 (GOWN DISPOSABLE) ×12
GOWN STRL REUS W/TWL XL LVL3 (GOWN DISPOSABLE) ×9
GRASPER SUT TROCAR 14GX15 (MISCELLANEOUS) ×3 IMPLANT
IRRIGATION STRYKERFLOW (MISCELLANEOUS) ×2 IMPLANT
IRRIGATOR STRYKERFLOW (MISCELLANEOUS) ×3
IV LACTATED RINGERS 1000ML (IV SOLUTION) ×5 IMPLANT
IV NS 1000ML (IV SOLUTION) ×3
IV NS 1000ML BAXH (IV SOLUTION) IMPLANT
KIT PINK PAD W/HEAD ARE REST (MISCELLANEOUS) ×3
KIT PINK PAD W/HEAD ARM REST (MISCELLANEOUS) ×2 IMPLANT
KIT TURNOVER CYSTO (KITS) ×3 IMPLANT
LABEL OR SOLS (LABEL) ×3 IMPLANT
MANIFOLD NEPTUNE II (INSTRUMENTS) ×3 IMPLANT
MANIPULATOR VCARE STD CRV RETR (MISCELLANEOUS) ×1 IMPLANT
NDL HPO THNWL 1X22GA REG BVL (NEEDLE) IMPLANT
NEEDLE SAFETY 22GX1 (NEEDLE) ×3
NEEDLE VERESS 14GA 120MM (NEEDLE) ×3 IMPLANT
NS IRRIG 500ML POUR BTL (IV SOLUTION) ×3 IMPLANT
OCCLUDER COLPOPNEUMO (BALLOONS) ×3 IMPLANT
PACK GYN LAPAROSCOPIC (MISCELLANEOUS) ×3 IMPLANT
PAD OB MATERNITY 4.3X12.25 (PERSONAL CARE ITEMS) ×3 IMPLANT
PAD PREP 24X41 OB/GYN DISP (PERSONAL CARE ITEMS) ×3 IMPLANT
PORT ACCESS TROCAR AIRSEAL 12 (TROCAR) ×2 IMPLANT
PORT ACCESS TROCAR AIRSEAL 5M (TROCAR) ×1
SCISSORS METZENBAUM CVD 33 (INSTRUMENTS) ×1 IMPLANT
SCRUB EXIDINE 4% CHG 4OZ (MISCELLANEOUS) ×3 IMPLANT
SET CYSTO W/LG BORE CLAMP LF (SET/KITS/TRAYS/PACK) ×3 IMPLANT
SET TRI-LUMEN FLTR TB AIRSEAL (TUBING) ×3 IMPLANT
SHEARS HARMONIC ACE PLUS 36CM (ENDOMECHANICALS) ×4 IMPLANT
SLEEVE ENDOPATH XCEL 5M (ENDOMECHANICALS) ×3 IMPLANT
SPONGE GAUZE 2X2 8PLY STRL LF (GAUZE/BANDAGES/DRESSINGS) ×2 IMPLANT
SURGILUBE 2OZ TUBE FLIPTOP (MISCELLANEOUS) ×3 IMPLANT
SUT ENDO VLOC 180-0-8IN (SUTURE) ×1 IMPLANT
SUT ETHIBOND NAB CT1 #1 30IN (SUTURE) ×1 IMPLANT
SUT VIC AB 0 CT1 27 (SUTURE) ×3
SUT VIC AB 0 CT1 27XCR 8 STRN (SUTURE) IMPLANT
SUT VIC AB 0 CT1 36 (SUTURE) ×4 IMPLANT
SUT VIC AB 4-0 FS2 27 (SUTURE) ×3 IMPLANT
SYR 10ML LL (SYRINGE) ×3 IMPLANT
SYR 50ML LL SCALE MARK (SYRINGE) ×3 IMPLANT
TROCAR PORT AIRSEAL 8X100 (TROCAR) ×3 IMPLANT
TROCAR XCEL NON-BLD 5MMX100MML (ENDOMECHANICALS) ×3 IMPLANT
WATER STERILE IRR 500ML POUR (IV SOLUTION) ×3 IMPLANT

## 2020-12-13 NOTE — Anesthesia Preprocedure Evaluation (Addendum)
Anesthesia Evaluation  Patient identified by MRN, date of birth, ID band Patient awake    Reviewed: Allergy & Precautions, NPO status , Patient's Chart, lab work & pertinent test results  History of Anesthesia Complications (+) PONV, MALIGNANT HYPERTHERMIA, Family history of anesthesia reaction and history of anesthetic complications  Airway Mallampati: II  TM Distance: >3 FB Neck ROM: full    Dental  (+) Chipped, Poor Dentition   Pulmonary asthma , Current Smoker and Patient abstained from smoking.,    Pulmonary exam normal        Cardiovascular Exercise Tolerance: Good negative cardio ROS Normal cardiovascular exam     Neuro/Psych PSYCHIATRIC DISORDERS negative neurological ROS     GI/Hepatic negative GI ROS, Neg liver ROS, neg GERD  ,  Endo/Other  negative endocrine ROS  Renal/GU      Musculoskeletal   Abdominal   Peds  Hematology negative hematology ROS (+)   Anesthesia Other Findings Past Medical History: No date: Anxiety No date: Depression No date: Family history of adverse reaction to anesthesia     Comment:  paternal 1st cousin had malignant hyperthermia  Past Surgical History: 2018: CHOLECYSTECTOMY No date: COLONOSCOPY     Comment:  2014, 2016 02/13/2016: DIAGNOSTIC LAPAROSCOPY     Comment:  ovarian torsion 2012: KNEE SURGERY; Left 1997: MASTOIDECTOMY 01/13/2013: RHINOPLASTY 01/13/2013: SKIN LESION EXCISION     Comment:  on nose (benign) 2018: TUBAL LIGATION  BMI    Body Mass Index: 27.98 kg/m      Reproductive/Obstetrics negative OB ROS                             Anesthesia Physical Anesthesia Plan  ASA: 3  Anesthesia Plan: General ETT   Post-op Pain Management:    Induction: Intravenous  PONV Risk Score and Plan: Ondansetron, Dexamethasone, Midazolam, Treatment may vary due to age or medical condition, TIVA and Propofol infusion  Airway Management  Planned: Oral ETT  Additional Equipment:   Intra-op Plan:   Post-operative Plan: Extubation in OR  Informed Consent: I have reviewed the patients History and Physical, chart, labs and discussed the procedure including the risks, benefits and alternatives for the proposed anesthesia with the patient or authorized representative who has indicated his/her understanding and acceptance.     Dental Advisory Given  Plan Discussed with: Anesthesiologist, CRNA and Surgeon  Anesthesia Plan Comments: (Plan for non triggering anesthetic   Patient consented for risks of anesthesia including but not limited to:  - adverse reactions to medications - damage to eyes, teeth, lips or other oral mucosa - nerve damage due to positioning  - sore throat or hoarseness - Damage to heart, brain, nerves, lungs, other parts of body or loss of life  Patient voiced understanding.)       Anesthesia Quick Evaluation

## 2020-12-13 NOTE — Op Note (Signed)
Operative Report:  PRE-OP DIAGNOSIS: uterine prolapse urinary incontinence in female cystocele   POST-OP DIAGNOSIS: uterine prolapse urinary incontinence in female cystocele   PROCEDURE: Procedure(s): TOTAL LAPAROSCOPIC HYSTERECTOMY WITH BILATERAL SALPINGECTOMY CYSTOSCOPY  SURGEON: Annamarie Major, MD, FACOG  ASSISTANT: Dr Edison Pace, No other capable assistant available, in surgery requiring high level assistant.  ANESTHESIA: General endotracheal anesthesia  ESTIMATED BLOOD LOSS: less than 50   SPECIMENS: Uterus, Tubes.  COMPLICATIONS: None  DISPOSITION: stable to PACU  FINDINGS: Intraabdominal adhesions were not noted.  Min cystocele noted so anterior colporrhaphy not performed.  PROCEDURE:  The patient was taken to the OR where anesthesia was administed. She was prepped and draped in the normal sterile fashion in the dorsal lithotomy position in the [place] stirrups. A time out was performed. A Graves speculum was inserted, the cervix was grasped with a single tooth tenaculum and the endometrial cavity was sounded. The cervix was progressively dilated to a size 18 [place] with [employer] dilators. A V-Care uterine manipulator was inserted in the usual fashion without incident. Gloves were changed and attention was turned to the abdomen.   An infraumbilical transverse 8 mm skin incision was made with the scalpel after local anesthesia applied to the skin. A Veress-step needle was inserted in the usual fashion and confirmed using the hanging drop technique. A pneumoperitoneum was obtained by insufflation of CO2 (opening pressure of ) to . An 8 mm trocar is then placed under direct visualization with the laparoscope. A diagnostic laparoscopy was performed yielding the previously described findings. Attention was turned to the left lower quadrant where after visualization of the inferior epigastric vessels a 84mm skin incision was made with the scalpel. A 5 mm laparoscopic port was  inserted. The same procedure was repeated in the right lower quadrant with a 90mm trocar. Attention was turned to the left aspect of the uterus, where after visualization of the ureter, the round ligament was coagulated and transected using the 75mm Harmonic Scapel. The anterior and posterior leafs of the broad ligament were dissected off as the anterior one was coagulated and transected in a caudal direction towards the cuff of the uterine manipulator.  Attention was then turned to the left fallopian tube which was recognized by visualization of the fimbria. The tube is excised to its attachment to the uterus. The uterine-ovarian ligament and its blood vessels were carefully coagulated and transected using the Harmonic scapel.  Attention was turned to the right aspect of the uterus where the same procedure was performed.  The vesicouterine reflection of the peritoneum was dissected with the harmonic scapel and the bladder flap was created bluntly.  The uterine vessels were coagulated and transected bilaterally using first bipolar cautery and then the harmonic scapel. A 360 degree, circumferential colpotomy was done to completely amputate the uterus with cervix and tubes. Once the specimen was amputated it was delivered through the vagina.   The colpotomy was repaired in a simple running fashion using a delayed absorbable suture with an endo-stitch device.  Vaginal exam confirms complete closure.  The cavity was copiously irrigated. A survey of the pelvic cavity revealed adequate hemostasis and no injury to bowel, bladder, or ureter.  The assistance of my assisting-physician was vital to resect and retract interchangably with self on each side.   A diagnostic cystoscopy was performed using saline distension of bladder with no lesions or injuries noted.  Bilateral urine flow from each ureteral orifice is visualized.  At this point the procedure was finalized. The  umbilical fascia incision is closed with a vicryl  suture using the fascia closure device. All the instruments were removed from the patient's body. Gas was expelled and patient is leveled.  Incisions are closed with skin adhesive.    Patient goes to recovery room in stable condition.  All sponge, instrument, and needle counts are correct x2.     Annamarie Major, MD, [facility], [facility] 12/13/2020  2:58 PM

## 2020-12-13 NOTE — Anesthesia Procedure Notes (Signed)
Procedure Name: Intubation Date/Time: 12/13/2020 1:16 PM Performed by: Rolla Plate, CRNA Pre-anesthesia Checklist: Patient identified, Patient being monitored, Timeout performed, Emergency Drugs available and Suction available Patient Re-evaluated:Patient Re-evaluated prior to induction Oxygen Delivery Method: Circle system utilized Preoxygenation: Pre-oxygenation with 100% oxygen Induction Type: IV induction Ventilation: Mask ventilation without difficulty Laryngoscope Size: Mac and 3 Grade View: Grade I Tube type: Oral Tube size: 7.0 mm Number of attempts: 1 Airway Equipment and Method: Video-laryngoscopy Placement Confirmation: ETT inserted through vocal cords under direct vision, positive ETCO2 and breath sounds checked- equal and bilateral Secured at: 20 cm Tube secured with: Tape Dental Injury: Teeth and Oropharynx as per pre-operative assessment

## 2020-12-13 NOTE — Interval H&P Note (Signed)
History and Physical Interval Note:  12/13/2020 10:34 AM  Deborah Sanchez  has presented today for surgery, with the diagnosis of uterine prolapse urinary incontinence in female, cystocele.  The various methods of treatment have been discussed with the patient and family. After consideration of risks, benefits and other options for treatment, the patient has consented to  Procedure(s): TOTAL LAPAROSCOPIC HYSTERECTOMY WITH BILATERAL SALPINGECTOMY (Bilateral) ANTERIOR REPAIR (CYSTOCELE) (N/A) CYSTOSCOPY (N/A) as a surgical intervention.  The patient's history has been reviewed, patient examined, no change in status, stable for surgery.  I have reviewed the patient's chart and labs.  Questions were answered to the patient's satisfaction.     [name]

## 2020-12-13 NOTE — Transfer of Care (Signed)
Immediate Anesthesia Transfer of Care Note  Patient: Deborah Sanchez  Procedure(s) Performed: TOTAL LAPAROSCOPIC HYSTERECTOMY WITH BILATERAL SALPINGECTOMY (Bilateral) CYSTOSCOPY  Patient Location: PACU  Anesthesia Type:General  Level of Consciousness: awake and alert   Airway & Oxygen Therapy: Patient Spontanous Breathing and Patient connected to face mask oxygen  Post-op Assessment: Report given to RN and Post -op Vital signs reviewed and stable  Post vital signs: Reviewed  Last Vitals:  Vitals Value Taken Time  BP    Temp    Pulse    Resp    SpO2      Last Pain:  Vitals:   12/13/20 1045  TempSrc: Oral  PainSc: 3       Patients Stated Pain Goal: 0 (12/13/20 1045)  Complications: No notable events documented.

## 2020-12-13 NOTE — Discharge Instructions (Addendum)
AMBULATORY SURGERY  ?DISCHARGE INSTRUCTIONS ? ? ?The drugs that you were given will stay in your system until tomorrow so for the next 24 hours you should not: ? ?Drive an automobile ?Make any legal decisions ?Drink any alcoholic beverage ? ? ?You may resume regular meals tomorrow.  Today it is better to start with liquids and gradually work up to solid foods. ? ?You may eat anything you prefer, but it is better to start with liquids, then soup and crackers, and gradually work up to solid foods. ? ? ?Please notify your doctor immediately if you have any unusual bleeding, trouble breathing, redness and pain at the surgery site, drainage, fever, or pain not relieved by medication. ? ? ? ?Additional Instructions: ? ? ? ?Please contact your physician with any problems or Same Day Surgery at [phone], Monday through Friday 6 am to 4 pm, or [place] [facility] [place] Main number at [phone].  ?

## 2020-12-14 ENCOUNTER — Encounter: Payer: Self-pay | Admitting: Obstetrics & Gynecology

## 2020-12-14 NOTE — Anesthesia Postprocedure Evaluation (Signed)
Anesthesia Post Note  Patient: Fontaine Kossman  Procedure(s) Performed: TOTAL LAPAROSCOPIC HYSTERECTOMY WITH BILATERAL SALPINGECTOMY (Bilateral) CYSTOSCOPY  Patient location during evaluation: PACU Anesthesia Type: General Level of consciousness: awake and alert Pain management: pain level controlled Vital Signs Assessment: post-procedure vital signs reviewed and stable Respiratory status: spontaneous breathing, nonlabored ventilation, respiratory function stable and patient connected to nasal cannula oxygen Cardiovascular status: blood pressure returned to baseline and stable Postop Assessment: no apparent nausea or vomiting Anesthetic complications: no   No notable events documented.   Last Vitals:  Vitals:   12/13/20 1615 12/13/20 1633  BP: 114/81 124/73  Pulse: 82 73  Resp: 10 18  Temp: 36.6 C 36.9 C  SpO2: 96% 99%    Last Pain:  Vitals:   12/13/20 1633  TempSrc: Temporal  PainSc: 9                  A[address] K Haliyah Fryman

## 2020-12-15 ENCOUNTER — Telehealth: Payer: BC Managed Care – PPO | Admitting: Physician Assistant

## 2020-12-15 ENCOUNTER — Other Ambulatory Visit: Payer: Self-pay

## 2020-12-15 ENCOUNTER — Encounter: Payer: Self-pay | Admitting: Physician Assistant

## 2020-12-15 DIAGNOSIS — R109 Unspecified abdominal pain: Secondary | ICD-10-CM

## 2020-12-15 DIAGNOSIS — R102 Pelvic and perineal pain: Secondary | ICD-10-CM

## 2020-12-15 NOTE — Progress Notes (Signed)
Based on what you shared with me, I feel your condition warrants further evaluation and I recommend that you be seen in a face to face visit.   Due to the recent surgery and continue severe pain, I would recommend that you have a face to face visit for further evaluation.   NOTE: There will be NO CHARGE for this eVisit   If you are having a true medical emergency please call 911.      For an urgent face to face visit, [facility] has six urgent care centers for your convenience:     [facility] Care Center at [facility] Directions [phone] [address] [place], [place] [address]    [facility] Health Urgent Care Center [facility]) Get Driving Directions [phone] [address] [place], [place] [address]  [facility] Urgent Care Center [facility]) Get Driving Directions [phone] [address] [place],  [place]  [address]  [facility] Health Urgent Care at [address] Get Driving Directions [phone] [address], [address] [place], [place] [address]   [facility] Health Urgent Care at [facility] Get Driving Directions  [phone] [address] [place], [place] [address]   [facility]gent Care at [facility] Directions [phone] [address], [address] [place], [place] [address]  Your MyChart E-visit questionnaire answers were reviewed by a board certified advanced clinical practitioner to complete your personal care plan based on your specific symptoms.  Thank you for using e-Visits.   I spent 5-10 minutes on review and completion of this note- [facility]

## 2020-12-16 ENCOUNTER — Other Ambulatory Visit: Payer: Self-pay

## 2020-12-16 ENCOUNTER — Emergency Department
Admission: EM | Admit: 2020-12-16 | Discharge: 2020-12-16 | Disposition: A | Discharge status: Home / Self Care | Payer: BC Managed Care – PPO | Attending: Emergency Medicine | Admitting: Emergency Medicine

## 2020-12-16 ENCOUNTER — Encounter: Payer: Self-pay | Admitting: Physician Assistant

## 2020-12-16 DIAGNOSIS — J45909 Unspecified asthma, uncomplicated: Secondary | ICD-10-CM | POA: Insufficient documentation

## 2020-12-16 DIAGNOSIS — Z760 Encounter for issue of repeat prescription: Secondary | ICD-10-CM | POA: Diagnosis not present

## 2020-12-16 DIAGNOSIS — F17210 Nicotine dependence, cigarettes, uncomplicated: Secondary | ICD-10-CM | POA: Diagnosis not present

## 2020-12-16 DIAGNOSIS — G8918 Other acute postprocedural pain: Secondary | ICD-10-CM | POA: Insufficient documentation

## 2020-12-16 MED ORDER — ONDANSETRON 4 MG PO TBDP: 4.0000 mg | ORAL_TABLET | Freq: Three times a day (TID) | ORAL | 0 refills | Status: DC | PRN

## 2020-12-16 MED ORDER — CYCLOBENZAPRINE HCL 5 MG PO TABS: 5.0000 mg | ORAL_TABLET | Freq: Three times a day (TID) | ORAL | 0 refills | Status: DC | PRN

## 2020-12-16 MED ORDER — OXYCODONE-ACETAMINOPHEN 5-325 MG PO TABS: 1.0000 | ORAL_TABLET | Freq: Four times a day (QID) | ORAL | 0 refills | Status: AC | PRN

## 2020-12-16 NOTE — Discharge Instructions (Addendum)
Take the prescription meds as directed. Follow-up with Dr. Tiburcio Pea for ongoing pain management.

## 2020-12-16 NOTE — ED Provider Notes (Signed)
[facility] Emergency Department Provider Note ____________________________________________  Time seen: 1112  I have reviewed the triage vital signs and the nursing notes.  HISTORY  Chief Complaint  Abdominal Pain   HPI Deborah Sanchez is a 30 y.o. female 3 days s/p laparoscopic hysterectomy for uterine prolapse. She denies fevers, chills, sweats, vomiting, diarrhea.  She presents to the ED with request for pain medicine refill.  Patient reports she made contact with the OB office on Friday, and was given authorized station to double her hydrocodone dosing.  She reports that this rate she will run out of medication today.  She denies any nausea, constipation, diarrhea or wound dehiscence.  She does note that the glue patch did get dislodged from one of her laparoscopic wounds.  Past Medical History:  Diagnosis Date   Anxiety    Depression    Family history of adverse reaction to anesthesia    paternal 1st cousin had malignant hyperthermia    Patient Active Problem List   Diagnosis Date Noted   Uterine prolapse 12/13/2020   Asthma without status asthmaticus 03/15/2018   Subchondral insufficiency fracture of condyle of left femur (HCC) 02/02/2018   Chronic pain of left knee 02/02/2018   Chronic pain syndrome 02/02/2018   History of arthroscopy of left knee 10/27/2017    Past Surgical History:  Procedure Laterality Date   CHOLECYSTECTOMY  2018   COLONOSCOPY     2014, 2016   CYSTOSCOPY N/A 12/13/2020   Procedure: CYSTOSCOPY;  Surgeon: Nadara Mustard, MD;  Location: [facility] ORS;  Service: Gynecology;  Laterality: N/A;   DIAGNOSTIC LAPAROSCOPY  02/13/2016   ovarian torsion   KNEE SURGERY Left 2012   MASTOIDECTOMY  1997   RHINOPLASTY  01/13/2013   SKIN LESION EXCISION  01/13/2013   on nose (benign)   TOTAL LAPAROSCOPIC HYSTERECTOMY WITH SALPINGECTOMY Bilateral 12/13/2020   Procedure: TOTAL LAPAROSCOPIC HYSTERECTOMY WITH BILATERAL SALPINGECTOMY;  Surgeon:  Nadara Mustard, MD;  Location: [facility] ORS;  Service: Gynecology;  Laterality: Bilateral;   TUBAL LIGATION  2018    Prior to Admission medications   Medication Sig Start Date End Date Taking? Authorizing Provider  cyclobenzaprine (FLEXERIL) 5 MG tablet Take 1-2 tablets (5-10 mg total) by mouth 3 (three) times daily as needed for muscle spasms. 12/16/20  Yes Riya Huxford, Charlesetta Ivory, PA-C  ondansetron (ZOFRAN ODT) 4 MG disintegrating tablet Take 1 tablet (4 mg total) by mouth every 8 (eight) hours as needed. 12/16/20  Yes Janyce Ellinger, Charlesetta Ivory, PA-C  oxyCODONE-acetaminophen (PERCOCET) 5-325 MG tablet Take 1 tablet by mouth every 6 (six) hours as needed for up to 3 days for severe pain. 12/16/20 12/19/20 Yes Mycah Mcdougall, Charlesetta Ivory, PA-C  Buprenorphine HCl-Naloxone HCl 8-2 MG FILM Place 1-1.5 Film under the tongue 3 (three) times daily as needed (opioid dependence). 11/22/20   [provider]  FLUoxetine (PROZAC) 20 MG capsule Take 20 mg by mouth daily. 07/26/20   [provider]  gabapentin (NEURONTIN) 800 MG tablet Take 800 mg by mouth 3 (three) times daily. 08/30/20   [provider]  ibuprofen (ADVIL) 200 MG tablet Take 800 mg by mouth every 6 (six) hours as needed for headache or moderate pain.    [provider]  methocarbamol (ROBAXIN) 500 MG tablet Take 500 mg by mouth 2 (two) times daily. 09/27/20   [provider]  oxyCODONE-acetaminophen (PERCOCET/ROXICET) 5-325 MG tablet Take 1 tablet by mouth every 4 (four) hours as needed for moderate pain.  12/13/20   Nadara Mustard, MD  traZODone (DESYREL) 100 MG tablet Take 100 mg by mouth at bedtime as needed for sleep. 09/17/20   [provider]    Allergies Patient has no known allergies.  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 Cardiovascular: Negative for chest pain. Respiratory: Negative for shortness of breath. Gastrointestinal: Negative for abdominal pain, vomiting and diarrhea. Genitourinary: Negative for dysuria. Musculoskeletal: Negative for back pain. Skin: Negative for rash. Neurological: Negative for headaches, focal weakness or numbness. ____________________________________________  PHYSICAL EXAM:  VITAL SIGNS: ED Triage Vitals [12/16/20 0850]  Enc Vitals Group     BP 106/88     Pulse Rate 87     Resp 18     Temp 98 F (36.7 C)     Temp Source Oral     SpO2 98 %     Weight 153 lb (69.4 kg)     Height 5\' 2"  (1.575 m)     Head Circumference      Peak Flow      Pain Score 9     Pain Loc      Pain Edu?      Excl. in GC?     Constitutional: Alert and oriented. Well appearing and in no distress. Head: Normocephalic and atraumatic. Eyes: Conjunctivae are normal. Normal extraocular movements Cardiovascular: Normal rate, regular rhythm. Normal distal pulses. Respiratory: Normal respiratory effort. No wheezes/rales/rhonchi. Gastrointestinal: Soft and nontender. No distention.  Normal bowel sounds noted.  Laparoscopic wounds are clean and dry without signs of dehiscence. Musculoskeletal: Nontender with normal range of motion in all extremities.  Neurologic:  Normal gait without ataxia. Normal speech and language. No gross focal neurologic deficits are appreciated. Skin:  Skin is warm, dry and intact. No rash noted. Psychiatric: Mood and affect are normal. Patient exhibits appropriate insight and judgment. ____________________________________________    {LABS (pertinent positives/negatives)  Labs  Reviewed - No data to display  __________________________________________  {EKG  ____________________________________________   RADIOLOGY Official radiology report(s): No results found. ____________________________________________  PROCEDURES   Procedures ____________________________________________   INITIAL IMPRESSION / ASSESSMENT AND PLAN / ED COURSE  As part of my medical decision making, I reviewed the following data within the electronic MEDICAL RECORD NUMBER Notes from prior ED visits and [place] Controlled Substance Database    Patient ED evaluation request for pain medicine refilled.  Patient is 4 days status post laparoscopic hysterectomy.  She is stable at this time without fever or tachycardia or signs of toxic appearance.  Wounds are clean and without dehiscence.  A prescription for Percocet provided for her benefit as well as a prescription for Flexeril and Zofran.  Patient will follow-up with her OB provider next week for ongoing management of her symptoms.   Kada Charde Macfarlane was evaluated in Emergency Department on 12/16/2020 for the symptoms described in the history of present illness. She was evaluated in  the context of the global COVID-19 pandemic, which necessitated consideration that the patient might be at risk for infection with the SARS-CoV-2 virus that causes COVID-19. Institutional protocols and algorithms that pertain to the evaluation of patients at risk for COVID-19 are in a state of rapid change based on information released by regulatory bodies including the CDC and federal and state organizations. These policies and algorithms were followed during the patient's care in the ED.  I reviewed the patient's prescription history over the last 12 months in the multi-state controlled substances database(s) that includes [place], [place], [place], [place], [place], [place], [place], [place], [place], [place], [place], [place], [place], and [place].  Results were notable for recent RX noted.  ____________________________________________  FINAL CLINICAL IMPRESSION(S) / ED DIAGNOSES  Final diagnoses:  Post-op pain      Karmen Stabs, Charlesetta Ivory, PA-C 12/16/20 1528    Chesley Noon, MD 12/17/20 1621

## 2020-12-16 NOTE — ED Notes (Signed)
See triage note  Presents with increased pain to incision site to left side of abd  Had hyst last Thursday  Describes pain as stabbing to left side of abd

## 2020-12-16 NOTE — ED Triage Notes (Signed)
Pt here with abd pain after having a hysterectomy on Thurs here at [facility]. Pt feels like her incision is opening up and is red and hurts. Pt was instructed to come to the ED since her OB office is closed.

## 2020-12-18 ENCOUNTER — Other Ambulatory Visit: Payer: Self-pay | Admitting: Obstetrics and Gynecology

## 2020-12-18 DIAGNOSIS — T8149XA Infection following a procedure, other surgical site, initial encounter: Secondary | ICD-10-CM

## 2020-12-18 DIAGNOSIS — R52 Pain, unspecified: Secondary | ICD-10-CM

## 2020-12-18 MED ORDER — OXYCODONE HCL 5 MG PO TABS
5.0000 mg | ORAL_TABLET | ORAL | 0 refills | Status: DC | PRN
Start: 2020-12-18 — End: 2021-01-02

## 2020-12-18 MED ORDER — SULFAMETHOXAZOLE-TRIMETHOPRIM 800-160 MG PO TABS: 1.0000 | ORAL_TABLET | Freq: Two times a day (BID) | ORAL | 0 refills | Status: DC

## 2020-12-18 NOTE — Telephone Encounter (Signed)
Please advise since [facility] is out of the office

## 2020-12-18 NOTE — Progress Notes (Signed)
Patient reports one bowel movement since the surgery yesterday.  Continued severe pain not controlled by medications.  Recommended patient going to the emergency room, but she refused because she was seen over the weekend and did not have labs or imaging. She is scheduled to see me tomorrow for a visit and wants to wait until that time. I sent her an antibiotic for possible incisional infection. I called and spoke to her pharmacist. He reports that she picked up 12 Percocet pill yesterday as a 3 day supply sent by Dr. Karmen Stabs.  I was able to send an additional prescription for 20 pills of Roxicodone.   Adelene Idler MD, [facility], [facility] 12/18/2020 6:08 PM

## 2020-12-18 NOTE — Telephone Encounter (Signed)
Can you look into this? [facility] is not here and pt has not heard from [facility] who's on call

## 2020-12-18 NOTE — Telephone Encounter (Signed)
Please advise 

## 2020-12-19 ENCOUNTER — Encounter: Payer: Self-pay | Admitting: Obstetrics and Gynecology

## 2020-12-19 ENCOUNTER — Ambulatory Visit (INDEPENDENT_AMBULATORY_CARE_PROVIDER_SITE_OTHER): Payer: BC Managed Care – PPO | Admitting: Obstetrics and Gynecology

## 2020-12-19 ENCOUNTER — Other Ambulatory Visit: Payer: Self-pay

## 2020-12-19 ENCOUNTER — Ambulatory Visit
Admission: RE | Admit: 2020-12-19 | Discharge: 2020-12-19 | Disposition: A | Discharge status: Home / Self Care | Payer: BC Managed Care – PPO | Source: Ambulatory Visit | Attending: Obstetrics and Gynecology | Admitting: Obstetrics and Gynecology

## 2020-12-19 VITALS — BP 126/84

## 2020-12-19 DIAGNOSIS — G8918 Other acute postprocedural pain: Secondary | ICD-10-CM | POA: Diagnosis present

## 2020-12-19 LAB — CBC WITH DIFFERENTIAL
Basophils Absolute: 0 10*3/uL (ref 0.0–0.2)
Basos: 0 %
EOS (ABSOLUTE): 0.7 10*3/uL — ABNORMAL HIGH (ref 0.0–0.4)
Eos: 6 %
Hematocrit: 37.7 % (ref 34.0–46.6)
Hemoglobin: 12.9 g/dL (ref 11.1–15.9)
Immature Grans (Abs): 0.1 10*3/uL (ref 0.0–0.1)
Immature Granulocytes: 1 %
Lymphocytes Absolute: 1.5 10*3/uL (ref 0.7–3.1)
Lymphs: 13 %
MCH: 30.6 pg (ref 26.6–33.0)
MCHC: 34.2 g/dL (ref 31.5–35.7)
MCV: 90 fL (ref 79–97)
Monocytes Absolute: 0.6 10*3/uL (ref 0.1–0.9)
Monocytes: 5 %
Neutrophils Absolute: 8.5 10*3/uL — ABNORMAL HIGH (ref 1.4–7.0)
Neutrophils: 75 %
RBC: 4.21 x10E6/uL (ref 3.77–5.28)
RDW: 12.7 % (ref 11.7–15.4)
WBC: 11.4 10*3/uL — ABNORMAL HIGH (ref 3.4–10.8)

## 2020-12-19 LAB — POCT URINALYSIS DIPSTICK
Bilirubin, UA: NEGATIVE
Blood, UA: NEGATIVE
Glucose, UA: NEGATIVE
Ketones, UA: NEGATIVE
Nitrite, UA: NEGATIVE
Protein, UA: NEGATIVE
Spec Grav, UA: 1.02 (ref 1.010–1.025)
Urobilinogen, UA: 0.2 E.U./dL
pH, UA: 7 (ref 5.0–8.0)

## 2020-12-19 LAB — COMPREHENSIVE METABOLIC PANEL
ALT: 7 IU/L (ref 0–32)
AST: 19 IU/L (ref 0–40)
Albumin/Globulin Ratio: 2.1 (ref 1.2–2.2)
Albumin: 4 g/dL (ref 3.9–5.0)
Alkaline Phosphatase: 71 IU/L (ref 44–121)
BUN/Creatinine Ratio: 14 (ref 9–23)
BUN: 8 mg/dL (ref 6–20)
Bilirubin Total: <0.2 mg/dL (ref 0.0–1.2)
CO2: 25 mmol/L (ref 20–29)
Calcium: 9 mg/dL (ref 8.7–10.2)
Chloride: 102 mmol/L (ref 96–106)
Creatinine, Ser: 0.58 mg/dL (ref 0.57–1.00)
Globulin, Total: 1.9 g/dL (ref 1.5–4.5)
Glucose: 69 mg/dL (ref 65–99)
Potassium: 4.5 mmol/L (ref 3.5–5.2)
Sodium: 139 mmol/L (ref 134–144)
Total Protein: 5.9 g/dL — ABNORMAL LOW (ref 6.0–8.5)
eGFR: 125 mL/min/{1.73_m2} (ref >59)

## 2020-12-19 LAB — SURGICAL PATHOLOGY

## 2020-12-19 MED ORDER — IOHEXOL 350 MG/ML SOLN
80.0000 mL | Freq: Once | INTRAVENOUS | Status: AC | PRN
  Administered 2020-12-19: 80 mL via INTRAVENOUS

## 2020-12-19 NOTE — Progress Notes (Signed)
Patient ID: Deborah Sanchez, female   DOB: 1990/09/13, 30 y.o.   MRN: [id number]  Reason for Consult: Wound Check   Referred by Myrene Buddy, *  Subjective:     HPI:  Deborah Sanchez is a 30 y.o. female she had a hysterectomy performed last week by Dr. Tiburcio Pea.  She reports that since that time she has been having severe uncontrollable abdominal pain.  She reports that the pain is mostly on her left side and extends around to her flank.  She denies any pain with urination.  She reports that she is having regular bowel movements.  She reports that she is taking MiraLAX to help with bowel movements so that they are soft.  She reports that she is passing gas.  She has had some nausea but no vomiting.  She is taking needing to take a large amount of Percocet or oxycodone to control her pain.  Last night I prescribed her 20 additional Roxicodone pills. I also prescribed her an antibiotic for her left incision.  She reports that this incision has been erythematous.  She reports that it is opened and drained purulent liquid.  She reports that since starting the antibiotic she is it is feeling slightly better.  She is alternating hot and cold packs for her pain along her abdomen.  She denies fevers at home.  She denies any vaginal bleeding.  She reports that she has a pulling sensation in her vagina.  Gynecological History  Patient's last menstrual period was 12/03/2020 (approximate).  Past Medical History:  Diagnosis Date   Anxiety    Depression    Family history of adverse reaction to anesthesia    paternal 1st cousin had malignant hyperthermia   Family History  Problem Relation Age of Onset   Diabetes Father    Past Surgical History:  Procedure Laterality Date   CHOLECYSTECTOMY  2018   COLONOSCOPY     2014, 2016   CYSTOSCOPY N/A 12/13/2020   Procedure: CYSTOSCOPY;  Surgeon: Nadara Mustard, MD;  Location: [facility] ORS;  Service: Gynecology;  Laterality: N/A;   DIAGNOSTIC  LAPAROSCOPY  02/13/2016   ovarian torsion   KNEE SURGERY Left 2012   MASTOIDECTOMY  1997   RHINOPLASTY  01/13/2013   SKIN LESION EXCISION  01/13/2013   on nose (benign)   TOTAL LAPAROSCOPIC HYSTERECTOMY WITH SALPINGECTOMY Bilateral 12/13/2020   Procedure: TOTAL LAPAROSCOPIC HYSTERECTOMY WITH BILATERAL SALPINGECTOMY;  Surgeon: Nadara Mustard, MD;  Location: [facility] ORS;  Service: Gynecology;  Laterality: Bilateral;   TUBAL LIGATION  2018    Short Social History:  Social History   Tobacco Use   Smoking status: Every Day    Packs/day: 0.25    Years: 7.00    Pack years: 1.75    Types: Cigarettes   Smokeless tobacco: Never  Substance Use Topics   Alcohol use: Not Currently    No Known Allergies  Current Outpatient Medications  Medication Sig Dispense Refill   Buprenorphine HCl-Naloxone HCl 8-2 MG FILM Place 1-1.5 Film under the tongue 3 (three) times daily as needed (opioid dependence).     cyclobenzaprine (FLEXERIL) 5 MG tablet Take 1-2 tablets (5-10 mg total) by mouth 3 (three) times daily as needed for muscle spasms. 15 tablet 0   FLUoxetine (PROZAC) 20 MG capsule Take 20 mg by mouth daily.     gabapentin (NEURONTIN) 800 MG tablet Take 800 mg by mouth 3 (three) times daily.     ibuprofen (ADVIL) 200 MG tablet  Take 800 mg by mouth every 6 (six) hours as needed for headache or moderate pain.     methocarbamol (ROBAXIN) 500 MG tablet Take 500 mg by mouth 2 (two) times daily.     ondansetron (ZOFRAN ODT) 4 MG disintegrating tablet Take 1 tablet (4 mg total) by mouth every 8 (eight) hours as needed. 15 tablet 0   oxyCODONE (ROXICODONE) 5 MG immediate release tablet Take 1 tablet (5 mg total) by mouth every 4 (four) hours as needed for severe pain. 20 tablet 0   oxyCODONE-acetaminophen (PERCOCET) 5-325 MG tablet Take 1 tablet by mouth every 6 (six) hours as needed for up to 3 days for severe pain. 12 tablet 0   oxyCODONE-acetaminophen (PERCOCET/ROXICET) 5-325 MG tablet Take 1 tablet by  mouth every 4 (four) hours as needed for moderate pain. 30 tablet 0   sulfamethoxazole-trimethoprim (BACTRIM DS) 800-160 MG tablet Take 1 tablet by mouth 2 (two) times daily. 14 tablet 0   traZODone (DESYREL) 100 MG tablet Take 100 mg by mouth at bedtime as needed for sleep.     No current facility-administered medications for this visit.    Review of Systems  Constitutional: Negative for chills, fatigue, fever and unexpected weight change.  HENT: Negative for trouble swallowing.  Eyes: Negative for loss of vision.  Respiratory: Negative for cough, shortness of breath and wheezing.  Cardiovascular: Negative for chest pain, leg swelling, palpitations and syncope.  GI: Positive for abdominal pain, diarrhea and nausea. Negative for blood in stool and vomiting.  GU: Negative for difficulty urinating, dysuria, frequency and hematuria.  Musculoskeletal: Negative for back pain, leg pain and joint pain.  Skin: Negative for rash.  Neurological: Negative for dizziness, headaches, light-headedness, numbness and seizures.  Psychiatric: Negative for behavioral problem, confusion, depressed mood and sleep disturbance.       Objective:  Objective   Vitals:   12/19/20 1139  BP: 126/84   There is no height or weight on file to calculate BMI.  Physical Exam Vitals and nursing note reviewed. Exam conducted with a chaperone present.  Constitutional:      Appearance: Normal appearance.  HENT:     Head: Normocephalic and atraumatic.  Eyes:     Extraocular Movements: Extraocular movements intact.     Pupils: Pupils are equal, round, and reactive to light.  Cardiovascular:     Rate and Rhythm: Normal rate and regular rhythm.  Pulmonary:     Effort: Pulmonary effort is normal.     Breath sounds: Normal breath sounds.  Abdominal:     General: Abdomen is flat.     Palpations: Abdomen is soft.     Tenderness: There is abdominal tenderness. There is guarding and rebound.    Musculoskeletal:      Cervical back: Normal range of motion.  Skin:    General: Skin is warm and dry.  Neurological:     General: No focal deficit present.     Mental Status: She is alert and oriented to person, place, and time.  Psychiatric:        Behavior: Behavior normal.        Thought Content: Thought content normal.        Judgment: Judgment normal.    Assessment/Plan:    30 year old status post hysterectomy with severe abdominal pain poorly controlled with oral narcotic medicine and a left-sided incisional infection. Will obtain stat CBC CMP and urine culture. Urinalysis is not suggestive of infection at this time Given patient's signs of guarding  and rebound will obtain CT for further imaging and possible surgical complications. Discussed using simethicone for gas pain regularly and Colace for stool softener as needed. Advised patient to continue her oral antibiotic for her wound/incisional infection in the left lateral incision.  More than 20 minutes were spent face to face with the patient in the room, reviewing the medical record, labs and images, and coordinating care for the patient. The plan of management was discussed in detail and counseling was provided.     Adelene Idler MD [facility], [facility] 12/19/2020 12:34 PM

## 2020-12-21 LAB — URINE CULTURE: Organism ID, Bacteria: NO GROWTH

## 2020-12-25 ENCOUNTER — Ambulatory Visit: Payer: BC Managed Care – PPO | Admitting: Obstetrics & Gynecology

## 2021-01-02 ENCOUNTER — Encounter: Payer: Self-pay | Admitting: Obstetrics & Gynecology

## 2021-01-02 ENCOUNTER — Ambulatory Visit: Payer: BC Managed Care – PPO | Admitting: Obstetrics & Gynecology

## 2021-01-02 ENCOUNTER — Telehealth (INDEPENDENT_AMBULATORY_CARE_PROVIDER_SITE_OTHER): Payer: BC Managed Care – PPO | Admitting: Obstetrics & Gynecology

## 2021-01-02 DIAGNOSIS — Z90710 Acquired absence of both cervix and uterus: Secondary | ICD-10-CM

## 2021-01-02 MED ORDER — OXYCODONE-ACETAMINOPHEN 5-325 MG PO TABS: 1.0000 | ORAL_TABLET | ORAL | 0 refills | Status: DC | PRN

## 2021-01-02 NOTE — Progress Notes (Signed)
  Virtual Visit via Telephone Note  I connected with Deborah Sanchez on 01/02/21 at  3:50 PM EDT by telephone and verified that I am speaking with the correct person using two identifiers.  Location: Patient: House (vacation home) Provider: Office   I discussed the limitations, risks, security and privacy concerns of performing an evaluation and management service by telephone and the availability of in person appointments. I also discussed with the patient that there may be a patient responsible charge related to this service. The patient expressed understanding and agreed to proceed.   Postoperative Follow-up Patient presents post op from  [facility]  for pelvic pain and pelvic relaxation, 3 weeks ago. Path: DIAGNOSIS:  A. 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       - NEGATIVE FOR INVASIVE CARCINOMA.  - ENDOMETRIUM:       - PROLIFERATIVE.  NEGATIVE FOR ATYPIA / EIN AND MALIGNANCY.  - MYOMETRIUM:       - NO SIGNIFICANT PATHOLOGIC ALTERATION.  - BILATERAL FALLOPIAN TUBES:       - NO SIGNIFICANT PATHOLOGIC ALTERATION. Subjective: Patient reports some improvement in her preop symptoms. Eating a regular diet without difficulty. Pain is controlled with current analgesics. Medications being used: ibuprofen (OTC) and narcotic analgesics including Percocet.  Activity: normal activities of daily living. Patient reports additional symptom's since surgery of None.    Observations/Objective: No exam today, due to telephone eVisit due to [facility] virus restriction on elective visits and procedures.  Prior visits reviewed along with ultrasounds/labs as indicated.  Assessment: s/p :  total laparoscopic hysterectomy with bilateral salpingectomy stable  Plan: Patient has done well after surgery with no apparent complications.  I have discussed the  post-operative course to date, and the expected progress moving forward.  The patient understands what complications to be concerned about.  I will see the patient in routine follow up, or sooner if needed.    Activity plan:  Pelvic rest.  [name] 01/02/2021, 4:17 PM

## 2021-01-03 ENCOUNTER — Telehealth: Payer: Self-pay

## 2021-01-03 NOTE — Telephone Encounter (Signed)
Called and left voicemail for patient to call back to confirm °

## 2021-01-03 NOTE — Telephone Encounter (Signed)
-----   Message from Nadara Mustard, MD sent at 01/02/2021  4:30 PM EDT ----- Regarding: Sch post op in 3 weeks

## 2021-01-04 ENCOUNTER — Other Ambulatory Visit: Payer: Self-pay | Admitting: Obstetrics & Gynecology

## 2021-01-04 MED ORDER — CEPHALEXIN 500 MG PO CAPS: 500.0000 mg | ORAL_CAPSULE | Freq: Four times a day (QID) | ORAL | 2 refills | Status: DC

## 2021-01-04 MED ORDER — OXYCODONE-ACETAMINOPHEN 5-325 MG PO TABS: 1.0000 | ORAL_TABLET | ORAL | 0 refills | Status: DC | PRN

## 2021-01-06 ENCOUNTER — Other Ambulatory Visit: Payer: Self-pay

## 2021-01-07 MED ORDER — OXYCODONE-ACETAMINOPHEN 5-325 MG PO TABS: 1.0000 | ORAL_TABLET | ORAL | 0 refills | Status: DC | PRN

## 2021-01-09 ENCOUNTER — Other Ambulatory Visit: Payer: Self-pay

## 2021-01-09 NOTE — Telephone Encounter (Signed)
Note sent to patient via My Chart after consult with Dr. Jerene Pitch who recently saw this patient. Renewal of opioid prescription is not appropriate.

## 2021-01-10 ENCOUNTER — Other Ambulatory Visit: Payer: Self-pay | Admitting: Obstetrics & Gynecology

## 2021-01-10 MED ORDER — MELOXICAM 15 MG PO TABS: 15.0000 mg | ORAL_TABLET | Freq: Two times a day (BID) | ORAL | 1 refills | Status: DC | PRN

## 2021-01-24 ENCOUNTER — Ambulatory Visit: Payer: BC Managed Care – PPO | Admitting: Obstetrics & Gynecology

## 2021-01-25 ENCOUNTER — Ambulatory Visit (INDEPENDENT_AMBULATORY_CARE_PROVIDER_SITE_OTHER): Payer: BC Managed Care – PPO | Admitting: Obstetrics & Gynecology

## 2021-01-25 ENCOUNTER — Other Ambulatory Visit: Payer: Self-pay

## 2021-01-25 ENCOUNTER — Encounter: Payer: Self-pay | Admitting: Obstetrics & Gynecology

## 2021-01-25 VITALS — BP 100/70 | Ht 62.0 in | Wt 152.0 lb

## 2021-01-25 DIAGNOSIS — Z90710 Acquired absence of both cervix and uterus: Secondary | ICD-10-CM | POA: Insufficient documentation

## 2021-01-25 NOTE — Progress Notes (Signed)
  Postoperative Follow-up Patient presents post op from  [facility]  for pelvic relaxation and as it turned out CIN3 of cervix , 6 weeks ago.  Subjective: Patient reports marked improvement in her preop symptoms. Eating a regular diet without difficulty. The patient is not having any pain.  Activity: normal activities of daily living. Patient reports additional symptom's since surgery of No bleeding.  Objective: BP 100/70   Ht 5\' 2"  (1.575 m)   Wt 152 lb (68.9 kg)   BMI 27.80 kg/m  Physical Exam Constitutional:      General: She is not in acute distress.    Appearance: She is well-developed.  Genitourinary:     Rectum normal.     Genitourinary Comments: Cervix and uterus absent. Vaginal cuff healing well.     No vaginal erythema or bleeding.      Right Adnexa: not tender and no mass present.    Left Adnexa: not tender and no mass present.    Cervix is absent.     Uterus is absent.  Cardiovascular:     Rate and Rhythm: Normal rate.  Pulmonary:     Effort: Pulmonary effort is normal.  Abdominal:     General: There is no distension.     Palpations: Abdomen is soft.     Tenderness: There is no abdominal tenderness.     Comments: Incision healing well.  Musculoskeletal:        General: Normal range of motion.  Neurological:     Mental Status: She is alert and oriented to person, place, and time.     Cranial Nerves: No cranial nerve deficit.  Skin:    General: Skin is warm and dry.    Assessment: s/p :  total laparoscopic hysterectomy with bilateral salpingectomy progressing well  Plan: Patient has done well after surgery with no apparent complications.  I have discussed the post-operative course to date, and the expected progress moving forward.  The patient understands what complications to be concerned about.  I will see the patient in routine follow up, or sooner if needed.    Activity plan: No restriction.   01/25/2021, 3:39 PM
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
## 2022-07-31 ENCOUNTER — Ambulatory Visit (INDEPENDENT_AMBULATORY_CARE_PROVIDER_SITE_OTHER): Payer: BC Managed Care – PPO | Admitting: Orthopedic Surgery

## 2022-07-31 ENCOUNTER — Encounter: Payer: Self-pay | Admitting: Orthopedic Surgery

## 2022-07-31 VITALS — BP 120/80 | Ht 62.0 in | Wt 201.0 lb

## 2022-07-31 DIAGNOSIS — M542 Cervicalgia: Secondary | ICD-10-CM | POA: Diagnosis not present

## 2022-07-31 DIAGNOSIS — M546 Pain in thoracic spine: Secondary | ICD-10-CM | POA: Diagnosis not present

## 2022-07-31 DIAGNOSIS — R519 Headache, unspecified: Secondary | ICD-10-CM

## 2022-07-31 DIAGNOSIS — M5412 Radiculopathy, cervical region: Secondary | ICD-10-CM | POA: Diagnosis not present

## 2022-07-31 DIAGNOSIS — R29898 Other symptoms and signs involving the musculoskeletal system: Secondary | ICD-10-CM

## 2022-07-31 NOTE — Patient Instructions (Signed)
It was so nice to see you today. Thank you so much for coming in.    I want to get an MRI of your neck and mid tack to look into things further. We will get this approved through your insurance and [facility] will call you to schedule the appointment.   Talk to your pain management provider about further pain medications.   Once I get the MRI results, we will call you to schedule a phone visit to review them.   Please do not hesitate to call if you have any questions or concerns. You can also message me in MyChart.   If you have not heard back about the MRIs in the next week, please call the office so we can help you get them scheduled.   Drake Leach PA-C [phone]

## 2022-08-21 ENCOUNTER — Ambulatory Visit
Admission: RE | Admit: 2022-08-21 | Discharge: 2022-08-21 | Disposition: A | Discharge status: Home / Self Care | Payer: BC Managed Care – PPO | Source: Ambulatory Visit | Attending: Orthopedic Surgery | Admitting: Orthopedic Surgery

## 2022-08-21 DIAGNOSIS — M542 Cervicalgia: Secondary | ICD-10-CM

## 2022-08-21 DIAGNOSIS — R29898 Other symptoms and signs involving the musculoskeletal system: Secondary | ICD-10-CM | POA: Insufficient documentation

## 2022-08-21 DIAGNOSIS — M546 Pain in thoracic spine: Secondary | ICD-10-CM | POA: Insufficient documentation

## 2022-08-21 DIAGNOSIS — M5412 Radiculopathy, cervical region: Secondary | ICD-10-CM | POA: Diagnosis present
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
## 2022-08-29 ENCOUNTER — Ambulatory Visit (INDEPENDENT_AMBULATORY_CARE_PROVIDER_SITE_OTHER): Payer: BC Managed Care – PPO | Admitting: Orthopedic Surgery

## 2022-08-29 ENCOUNTER — Telehealth: Payer: Self-pay | Admitting: Orthopedic Surgery

## 2022-08-29 ENCOUNTER — Encounter: Payer: Self-pay | Admitting: Orthopedic Surgery

## 2022-08-29 DIAGNOSIS — G950 Syringomyelia and syringobulbia: Secondary | ICD-10-CM | POA: Diagnosis not present

## 2022-08-29 DIAGNOSIS — R200 Anesthesia of skin: Secondary | ICD-10-CM

## 2022-08-29 DIAGNOSIS — M4722 Other spondylosis with radiculopathy, cervical region: Secondary | ICD-10-CM | POA: Diagnosis not present

## 2022-08-29 DIAGNOSIS — R202 Paresthesia of skin: Secondary | ICD-10-CM

## 2022-08-29 DIAGNOSIS — M546 Pain in thoracic spine: Secondary | ICD-10-CM

## 2022-08-29 DIAGNOSIS — M47812 Spondylosis without myelopathy or radiculopathy, cervical region: Secondary | ICD-10-CM

## 2022-08-29 DIAGNOSIS — R519 Headache, unspecified: Secondary | ICD-10-CM

## 2022-08-29 NOTE — Telephone Encounter (Signed)
Referral faxed for both EMG and headaches.

## 2022-08-29 NOTE — Telephone Encounter (Signed)
Order for EMG of bilateral upper extremities to evaluate numbness and tingling.   Please send to [facility] neurology in [place]- I also put in referral for her to see them for headaches.

## 2022-09-15 NOTE — Telephone Encounter (Signed)
Appt 09/16/2022

## 2022-09-22 NOTE — Telephone Encounter (Signed)
She has f/u with me on 10/27/22. Can discuss EMG then.

## 2022-09-22 NOTE — Telephone Encounter (Signed)
EMG 10/13/2022

## 2022-09-22 NOTE — Telephone Encounter (Signed)
She was seen for headaches on 09/16/2022. They will schedule her for EMGs.
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
## 2022-10-27 ENCOUNTER — Ambulatory Visit: Payer: BC Managed Care – PPO | Admitting: Orthopedic Surgery

## 2022-10-27 NOTE — Telephone Encounter (Signed)
EMG 11/19/2022

## 2022-12-04 ENCOUNTER — Ambulatory Visit: Payer: BC Managed Care – PPO | Admitting: Orthopedic Surgery

## 2022-12-12 ENCOUNTER — Other Ambulatory Visit: Payer: Self-pay | Admitting: Nurse Practitioner

## 2022-12-12 DIAGNOSIS — R1032 Left lower quadrant pain: Secondary | ICD-10-CM

## 2022-12-12 DIAGNOSIS — K625 Hemorrhage of anus and rectum: Secondary | ICD-10-CM
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
## 2022-12-16 ENCOUNTER — Ambulatory Visit (INDEPENDENT_AMBULATORY_CARE_PROVIDER_SITE_OTHER): Payer: BC Managed Care – PPO | Admitting: Orthopedic Surgery

## 2022-12-16 ENCOUNTER — Encounter: Payer: Self-pay | Admitting: Orthopedic Surgery

## 2022-12-16 VITALS — BP 134/72 | Ht 60.0 in | Wt 201.0 lb

## 2022-12-16 DIAGNOSIS — M4722 Other spondylosis with radiculopathy, cervical region: Secondary | ICD-10-CM | POA: Diagnosis not present

## 2022-12-16 DIAGNOSIS — M47812 Spondylosis without myelopathy or radiculopathy, cervical region: Secondary | ICD-10-CM

## 2022-12-16 DIAGNOSIS — M5412 Radiculopathy, cervical region: Secondary | ICD-10-CM

## 2022-12-16 DIAGNOSIS — R200 Anesthesia of skin: Secondary | ICD-10-CM | POA: Diagnosis not present

## 2022-12-16 DIAGNOSIS — R202 Paresthesia of skin: Secondary | ICD-10-CM

## 2022-12-18 ENCOUNTER — Ambulatory Visit
Admission: RE | Admit: 2022-12-18 | Discharge: 2022-12-18 | Disposition: A | Discharge status: Home / Self Care | Payer: BC Managed Care – PPO | Source: Ambulatory Visit | Attending: Nurse Practitioner | Admitting: Nurse Practitioner

## 2022-12-18 DIAGNOSIS — K625 Hemorrhage of anus and rectum: Secondary | ICD-10-CM

## 2022-12-18 DIAGNOSIS — R1032 Left lower quadrant pain: Secondary | ICD-10-CM

## 2023-02-23 ENCOUNTER — Ambulatory Visit: Payer: BC Managed Care – PPO

## 2023-02-23 DIAGNOSIS — K5289 Other specified noninfective gastroenteritis and colitis: Secondary | ICD-10-CM | POA: Diagnosis present

## 2023-02-23 DIAGNOSIS — Z538 Procedure and treatment not carried out for other reasons: Secondary | ICD-10-CM | POA: Diagnosis not present
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
## 2023-03-17 ENCOUNTER — Ambulatory Visit: Payer: BC Managed Care – PPO | Admitting: Orthopedic Surgery

## 2023-05-15 ENCOUNTER — Ambulatory Visit: Payer: BC Managed Care – PPO

## 2023-05-15 DIAGNOSIS — K6289 Other specified diseases of anus and rectum: Secondary | ICD-10-CM | POA: Diagnosis present

## 2023-05-15 DIAGNOSIS — K5289 Other specified noninfective gastroenteritis and colitis: Secondary | ICD-10-CM | POA: Diagnosis not present

## 2023-05-15 DIAGNOSIS — K640 First degree hemorrhoids: Secondary | ICD-10-CM | POA: Diagnosis not present

## 2023-09-23 ENCOUNTER — Other Ambulatory Visit: Payer: Self-pay | Admitting: Gastroenterology

## 2023-09-23 DIAGNOSIS — R748 Abnormal levels of other serum enzymes: Secondary | ICD-10-CM

## 2023-09-30 ENCOUNTER — Other Ambulatory Visit

## 2023-10-27 ENCOUNTER — Ambulatory Visit
Admission: RE | Admit: 2023-10-27 | Discharge: 2023-10-27 | Disposition: A | Discharge status: Home / Self Care | Source: Ambulatory Visit | Attending: Gastroenterology | Admitting: Gastroenterology

## 2023-10-27 DIAGNOSIS — R748 Abnormal levels of other serum enzymes: Secondary | ICD-10-CM

## 2023-11-13 ENCOUNTER — Encounter: Payer: Self-pay | Admitting: Emergency Medicine

## 2023-11-13 ENCOUNTER — Ambulatory Visit: Admission: EM | Admit: 2023-11-13 | Discharge: 2023-11-13 | Disposition: A | Discharge status: Home / Self Care

## 2023-11-13 DIAGNOSIS — L989 Disorder of the skin and subcutaneous tissue, unspecified: Secondary | ICD-10-CM

## 2023-11-13 DIAGNOSIS — K122 Cellulitis and abscess of mouth: Secondary | ICD-10-CM

## 2023-11-13 DIAGNOSIS — K120 Recurrent oral aphthae: Secondary | ICD-10-CM | POA: Diagnosis not present

## 2023-11-13 MED ORDER — TRIAMCINOLONE ACETONIDE 0.1 % MT PSTE: 1.0000 | PASTE | Freq: Two times a day (BID) | OROMUCOSAL | 0 refills | Status: AC

## 2023-11-13 MED ORDER — AMOXICILLIN-POT CLAVULANATE 875-125 MG PO TABS: 1.0000 | ORAL_TABLET | Freq: Two times a day (BID) | ORAL | 0 refills | Status: AC

## 2023-11-13 MED ORDER — LIDOCAINE VISCOUS HCL 2 % MT SOLN: 15.0000 mL | OROMUCOSAL | 0 refills | Status: AC | PRN

## 2023-11-13 MED ORDER — MUPIROCIN 2 % EX OINT: 1.0000 | TOPICAL_OINTMENT | Freq: Two times a day (BID) | CUTANEOUS | 0 refills | Status: AC

## 2023-11-13 NOTE — ED Provider Notes (Signed)
 [facility]    CSN: [id number] Arrival date & time: 11/13/23  1511      History   Chief Complaint Chief Complaint  Patient presents with   Dental Pain    HPI Casidee Jann is a 33 y.o. female presenting for 2 week history of concerns. Patient has noticed skin lesions of breasts in the past couple weeks. Reports bloody and pustular drainage from a breast lesion. The breast lesions have since dried up, but over the past 1 week she has noticed swelling/redness and ulceration of the roof of her mouth. Despite supportive care at home, she says the mouth lesion has increased and size and became more painful. Reports low grade fever 100.5 degrees today, nausea and swollen lymph nodes. No history of MRSA.  HPI  Past Medical History:  Diagnosis Date   Anxiety    Depression    Family history of adverse reaction to anesthesia    paternal 1st cousin had malignant hyperthermia    Patient Active Problem List   Diagnosis Date Noted   S/P laparoscopic hysterectomy 01/25/2021   Uterine prolapse 12/13/2020   Asthma without status asthmaticus 03/15/2018   Subchondral insufficiency fracture of condyle of left femur (HCC) 02/02/2018   Chronic pain of left knee 02/02/2018   Chronic pain syndrome 02/02/2018   History of arthroscopy of left knee 10/27/2017    Past Surgical History:  Procedure Laterality Date   CHOLECYSTECTOMY  2018   COLONOSCOPY     2014, 2016   CYSTOSCOPY N/A 12/13/2020   Procedure: CYSTOSCOPY;  Surgeon: Arloa Lamar SQUIBB, MD;  Location: [facility];  Service: Gynecology;  Laterality: N/A;   DIAGNOSTIC LAPAROSCOPY  02/13/2016   ovarian torsion   KNEE SURGERY Left 2012   MASTOIDECTOMY  1997   RHINOPLASTY  01/13/2013   SKIN LESION EXCISION  01/13/2013   on nose (benign)   TOTAL LAPAROSCOPIC HYSTERECTOMY WITH SALPINGECTOMY Bilateral 12/13/2020   Procedure: TOTAL LAPAROSCOPIC HYSTERECTOMY WITH BILATERAL SALPINGECTOMY;  Surgeon: Arloa Lamar SQUIBB, MD;  Location:  [facility];  Service: Gynecology;  Laterality: Bilateral;   TUBAL LIGATION  2018    OB History     Gravida  2   Para  2   Term  2   Preterm      AB      Living  2      SAB      IAB      Ectopic      Multiple      Live Births  2            Home Medications    Prior to Admission medications   Medication Sig Start Date End Date Taking? Authorizing Provider  AIMOVIG 70 MG/ML SOAJ Inject 1 mL into the skin every 30 (thirty) days.   Yes [provider]  amitriptyline (ELAVIL) 10 MG tablet Take by mouth.   Yes [provider]  amoxicillin-clavulanate (AUGMENTIN) 875-125 MG tablet Take 1 tablet by mouth every 12 (twelve) hours for 7 days. 11/13/23 11/20/23 Yes Arvis Huxley B, PA-C  lidocaine  (XYLOCAINE ) 2 % solution Use as directed 15 mLs in the mouth or throat every 3 (three) hours as needed for mouth pain (swish and spit). 11/13/23  Yes Arvis Huxley NOVAK, PA-C  methocarbamol (ROBAXIN) 500 MG tablet Take 500 mg by mouth 4 (four) times daily as needed.   Yes [provider]  mupirocin ointment (BACTROBAN) 2 % Apply 1 Application topically 2 (two) times daily. 11/13/23  Yes Arvis Huxley B, PA-C  omeprazole (PRILOSEC) 40 MG capsule TAKE 1 CAPSULE BY MOUTH EVERY MORNING WITH WATER 30 MINUTES PRIOR TO FOOD AND LIQUIDS. 12/08/22  Yes [provider]  triamcinolone (KENALOG) 0.1 % paste Use as directed 1 Application in the mouth or throat 2 (two) times daily. 11/13/23  Yes Arvis Huxley NOVAK, PA-C  Buprenorphine HCl-Naloxone HCl 8-2 MG FILM Place 1-1.5 Film under the tongue 3 (three) times daily as needed (opioid dependence). 11/22/20   [provider]  meloxicam  (MOBIC ) 15 MG tablet Take 15 mg by mouth 3 (three) times daily.    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   Review of Systems Review of Systems  Constitutional:  Positive for appetite change, fatigue and fever.  HENT:  Positive for dental problem and mouth sores. Negative for congestion and sore throat.   Gastrointestinal:  Positive for nausea. Negative for abdominal pain and vomiting.  Skin:  Positive for color change and wound.  Neurological:  Negative for dizziness, weakness and headaches.  Hematological:  Positive for adenopathy.     Physical Exam Triage Vital Signs ED Triage Vitals  Encounter Vitals Group     BP 11/13/23 1529 109/77     Girls Systolic BP Percentile --      Girls Diastolic BP Percentile --      Boys Systolic BP Percentile --      Boys Diastolic BP Percentile --      Pulse Rate 11/13/23 1529 78     Resp 11/13/23 1529 16     Temp 11/13/23 1529 98.7 F (37.1 C)     Temp Source 11/13/23 1529 Oral     SpO2 11/13/23 1529 95 %     Weight 11/13/23 1527 201 lb 1 oz (91.2 kg)     Height 11/13/23 1527 5' (1.524 m)     Head Circumference --      Peak Flow --      Pain Score 11/13/23 1526 6     Pain Loc --      Pain Education --      Exclude from Growth Chart --    No data found.  Updated Vital Signs BP 109/77 (BP Location: Right Arm)   Pulse 78   Temp 98.7 F (37.1 C) (Oral)   Resp 16   Ht 5' (1.524 m)   Wt 201 lb 1 oz (91.2 kg)   LMP 12/03/2020 (Approximate) Comment: neg preg test 12/13/20  SpO2 95%   BMI 39.27 kg/m     Physical Exam Vitals and nursing note reviewed.  Constitutional:      General: She is not in acute distress.    Appearance: Normal appearance. She is not ill-appearing or toxic-appearing.   HENT:     Head: Normocephalic and atraumatic.     Nose: Nose normal.  Mouth/Throat:     Mouth: Mucous membranes are moist.     Dentition: Abnormal dentition. Dental caries present.     Pharynx: Oropharynx is clear.      Comments: Moderate sized area of erythema/swelling hard palate. There is a central ulceration. Area is TTP Eyes:     General: No scleral icterus.       Right eye: No discharge.        Left eye: No discharge.     Conjunctiva/sclera: Conjunctivae normal.  Cardiovascular:     Rate and Rhythm: Normal rate and regular rhythm.     Heart sounds: Normal heart sounds.  Pulmonary:     Effort: Pulmonary effort is normal. No respiratory distress.     Breath sounds: Normal breath sounds.  Musculoskeletal:     Cervical back: Neck supple.  Skin:    General: Skin is dry.     Findings: Lesion present.     Comments: See images in chart. Scabbed lesions of both breasts  Neurological:     General: No focal deficit present.     Mental Status: She is alert. Mental status is at baseline.     Motor: No weakness.     Gait: Gait normal.  Psychiatric:        Mood and Affect: Mood normal.        Behavior: Behavior normal.         UC Treatments / Results  Labs (all labs ordered are listed, but only abnormal results are displayed) Labs Reviewed - No data to display  EKG   Radiology No results found.  Procedures Procedures (including critical care time)  Medications Ordered in UC Medications - No data to display  Initial Impression / Assessment and Plan / UC Course  I have reviewed the triage vital signs and the nursing notes.  Pertinent labs & imaging results that were available during my care of the patient were reviewed by me and considered in my medical decision making (see chart for details).   33 year old female presents for 2-week history of complaints.  She has had lesions on her breast which have drained pustular material but have since scabbed over.   Over the past week she has developed an area of swelling and pain of the roof of her mouth.  Has been taking OTC meds but condition of mouth worsens.  No history of MRSA.  Low-grade temp of 100.5 degrees today.  See images included in chart.  Developing cellulitis/abscess of the hard palate with overlying ulceration.  Dentition is abnormal.  She has multiple dental caries and missing teeth.  Scabbed over lesions on breast.  Treating at this time with Augmentin, triamcinolone paste, viscous lidocaine  and topical mupirocin ointment.  Reviewed supportive care.  Reviewed following up if not improving in a few days or symptoms worsen.   Final Clinical Impressions(s) / UC Diagnoses   Final diagnoses:  Cellulitis and abscess of mouth  Aphthous ulcer of mouth  Skin lesions     Discharge Instructions      - You have been developing abscess and cellulitis of the roof of your mouth along with a canker sore.  I sent antibiotics and topical triamcinolone paste for this. - Viscous lidocaine  for your mouth pain.  Continue Tylenol  as needed. - Mupirocin ointment for the other lesions.    ED Prescriptions     Medication Sig Dispense Auth. Provider   triamcinolone (KENALOG) 0.1 % paste Use as directed 1 Application in the mouth or  throat 2 (two) times daily. 5 g Arvis Huxley B, PA-C   lidocaine  (XYLOCAINE ) 2 % solution Use as directed 15 mLs in the mouth or throat every 3 (three) hours as needed for mouth pain (swish and spit). 100 mL Arvis Huxley B, PA-C   amoxicillin-clavulanate (AUGMENTIN) 875-125 MG tablet Take 1 tablet by mouth every 12 (twelve) hours for 7 days. 14 tablet Arvis Huxley B, PA-C   mupirocin ointment (BACTROBAN) 2 % Apply 1 Application topically 2 (two) times daily. 22 g Arvis Huxley NOVAK, PA-C      I have reviewed the PDMP during this encounter.   Arvis Huxley NOVAK, PA-C 11/13/23 [phone]

## 2023-11-13 NOTE — Discharge Instructions (Addendum)
-   You have been developing abscess and cellulitis of the roof of your mouth along with a canker sore.  I sent antibiotics and topical triamcinolone paste for this. - Viscous lidocaine  for your mouth pain.  Continue Tylenol  as needed. - Mupirocin ointment for the other lesions.

## 2023-11-13 NOTE — ED Triage Notes (Signed)
 Pt c/o mouth lesion. Started about 5 days ago. Lesion on the roof of her mouth. She states she feels like her lymph nodes are swollen, she's nauseous and low grade fever.

## 2023-11-19 ENCOUNTER — Ambulatory Visit

## 2023-11-19 DIAGNOSIS — K5190 Ulcerative colitis, unspecified, without complications: Secondary | ICD-10-CM | POA: Diagnosis present

## 2023-11-19 DIAGNOSIS — K640 First degree hemorrhoids: Secondary | ICD-10-CM | POA: Diagnosis not present
